# Patient Record
Sex: Female | Born: 1980
Health system: Southern US, Community
[De-identification: ages and names within clinical notes are randomized; demographics above are authoritative.]

## PROBLEM LIST (undated history)

## (undated) ENCOUNTER — Inpatient Hospital Stay (HOSPITAL_COMMUNITY): Payer: Self-pay

## (undated) DIAGNOSIS — R87619 Unspecified abnormal cytological findings in specimens from cervix uteri: Secondary | ICD-10-CM

## (undated) DIAGNOSIS — IMO0002 Reserved for concepts with insufficient information to code with codable children: Secondary | ICD-10-CM

## (undated) DIAGNOSIS — F329 Major depressive disorder, single episode, unspecified: Secondary | ICD-10-CM

## (undated) DIAGNOSIS — J45909 Unspecified asthma, uncomplicated: Secondary | ICD-10-CM

## (undated) DIAGNOSIS — F32A Depression, unspecified: Secondary | ICD-10-CM

## (undated) DIAGNOSIS — F419 Anxiety disorder, unspecified: Secondary | ICD-10-CM

## (undated) DIAGNOSIS — F429 Obsessive-compulsive disorder, unspecified: Secondary | ICD-10-CM

## (undated) DIAGNOSIS — Z8619 Personal history of other infectious and parasitic diseases: Secondary | ICD-10-CM

## (undated) HISTORY — DX: Unspecified asthma, uncomplicated: J45.909

## (undated) HISTORY — DX: Personal history of other infectious and parasitic diseases: Z86.19

## (undated) HISTORY — PX: TONSILLECTOMY: SUR1361

## (undated) HISTORY — PX: LEEP: SHX91

## (undated) HISTORY — DX: Anxiety disorder, unspecified: F41.9

---

## 2006-02-11 ENCOUNTER — Encounter: Admission: RE | Admit: 2006-02-11 | Discharge: 2006-02-11 | Payer: Self-pay | Admitting: Allergy

## 2006-08-04 ENCOUNTER — Encounter (INDEPENDENT_AMBULATORY_CARE_PROVIDER_SITE_OTHER): Payer: Self-pay | Admitting: Specialist

## 2006-08-04 ENCOUNTER — Ambulatory Visit (HOSPITAL_BASED_OUTPATIENT_CLINIC_OR_DEPARTMENT_OTHER): Admission: RE | Admit: 2006-08-04 | Discharge: 2006-08-04 | Payer: Self-pay | Admitting: Otolaryngology

## 2008-09-08 ENCOUNTER — Ambulatory Visit (HOSPITAL_COMMUNITY): Admission: RE | Admit: 2008-09-08 | Discharge: 2008-09-08 | Payer: Self-pay | Admitting: Obstetrics & Gynecology

## 2009-01-05 ENCOUNTER — Inpatient Hospital Stay (HOSPITAL_COMMUNITY): Admission: AD | Admit: 2009-01-05 | Discharge: 2009-01-05 | Payer: Self-pay | Admitting: Obstetrics and Gynecology

## 2009-01-05 ENCOUNTER — Inpatient Hospital Stay (HOSPITAL_COMMUNITY): Admission: AD | Admit: 2009-01-05 | Discharge: 2009-01-05 | Payer: Self-pay | Admitting: Obstetrics & Gynecology

## 2009-01-06 ENCOUNTER — Inpatient Hospital Stay (HOSPITAL_COMMUNITY): Admission: AD | Admit: 2009-01-06 | Discharge: 2009-01-09 | Payer: Self-pay | Admitting: Obstetrics

## 2009-01-07 ENCOUNTER — Encounter (INDEPENDENT_AMBULATORY_CARE_PROVIDER_SITE_OTHER): Payer: Self-pay | Admitting: Obstetrics

## 2010-02-21 IMAGING — US US ABDOMEN COMPLETE
1 series · 14 of 25 positions shown · non-contrast
Comparison: None

CLINICAL DATA: Right upper quadrant pain with burning and
sensation of pressure.  21 weeks estimated gestational age

COMPLETE ABDOMINAL ULTRASOUND

[Series 1: us abdomen complete · 0.25mm/px · 14 of 82 slices shown]
[im 1/82]
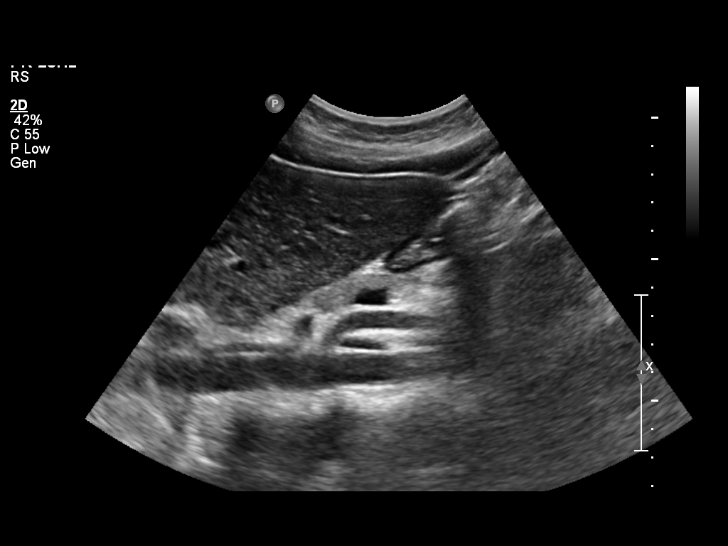
[im 7/82]
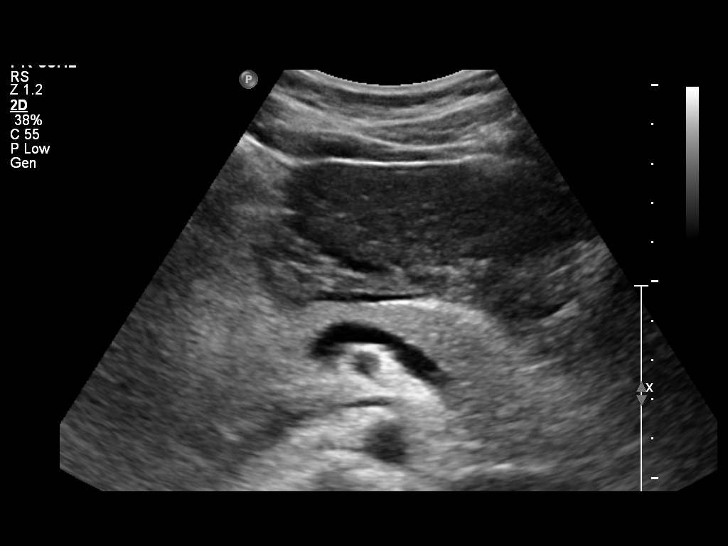
[im 14/82]
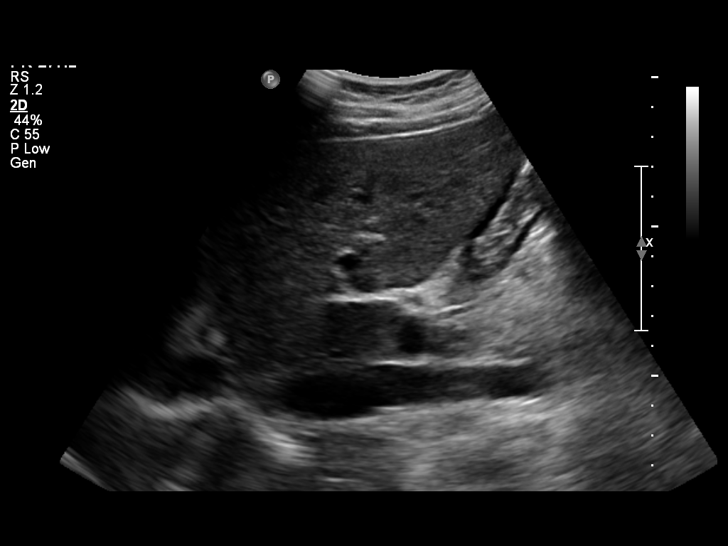
[im 21/82]
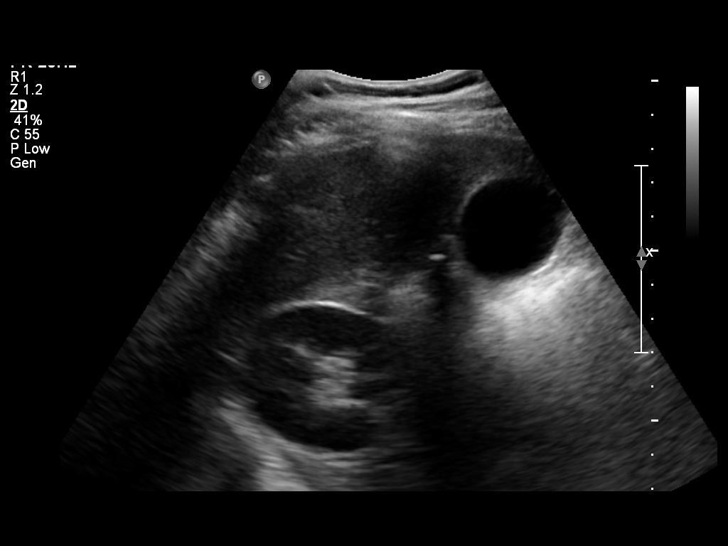
[im 28/82]
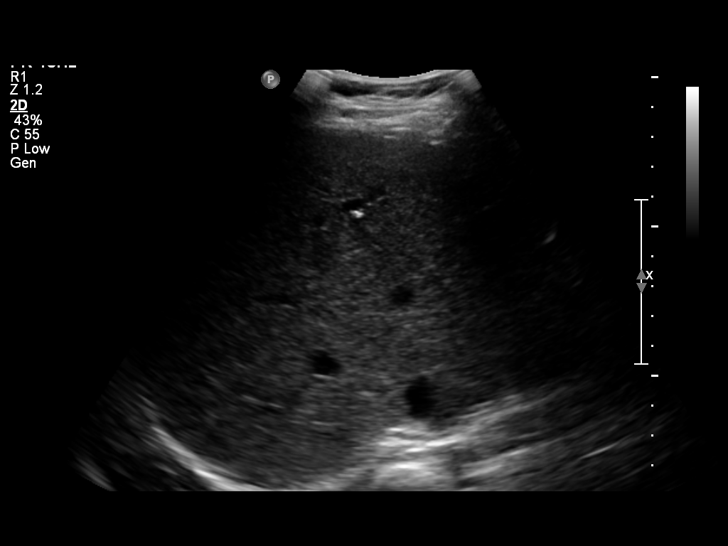
[im 31/82]
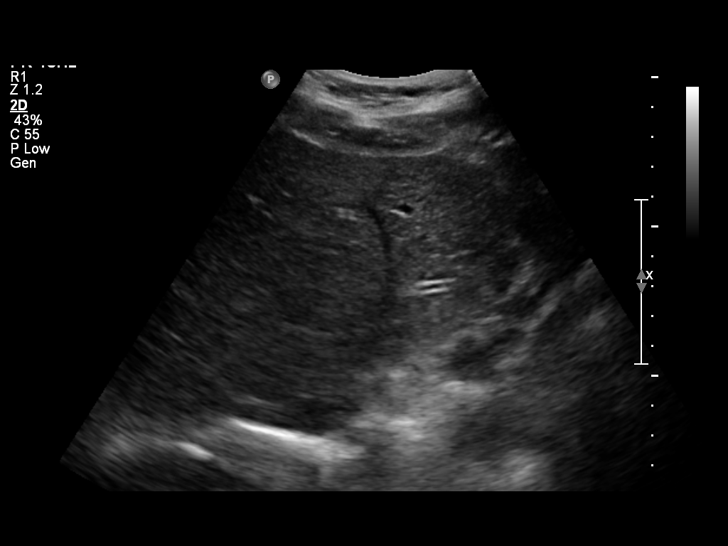
[im 38/82]
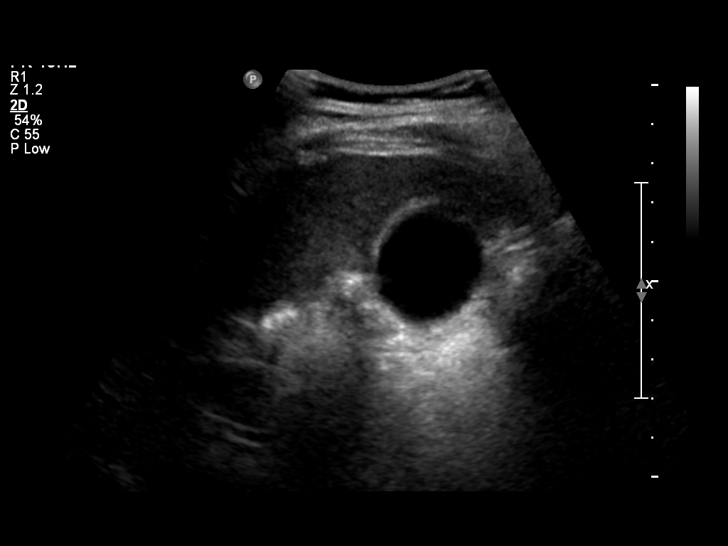
[im 44/82]
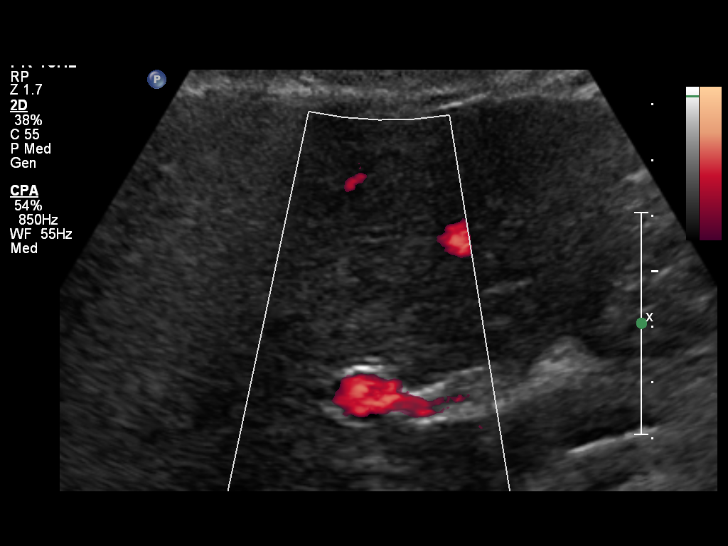
[im 51/82]
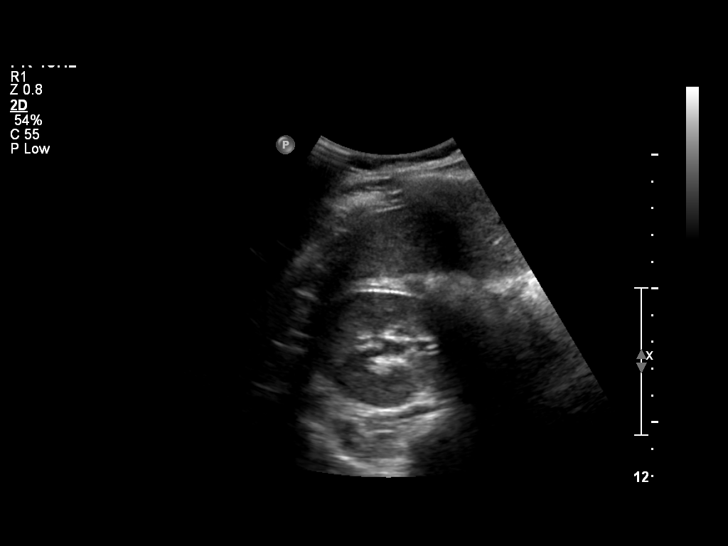
[im 55/82]
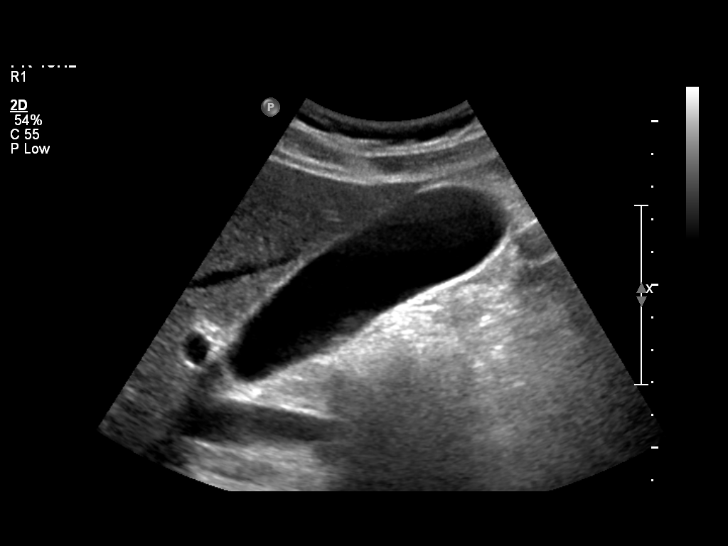
[im 61/82]
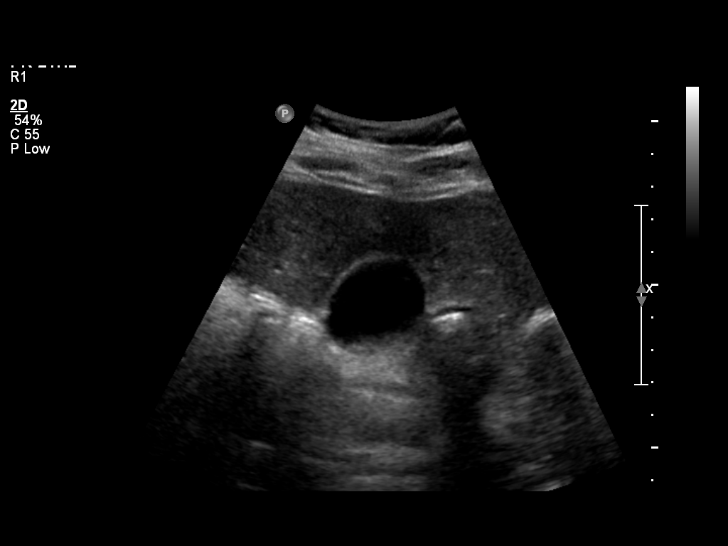
[im 68/82]
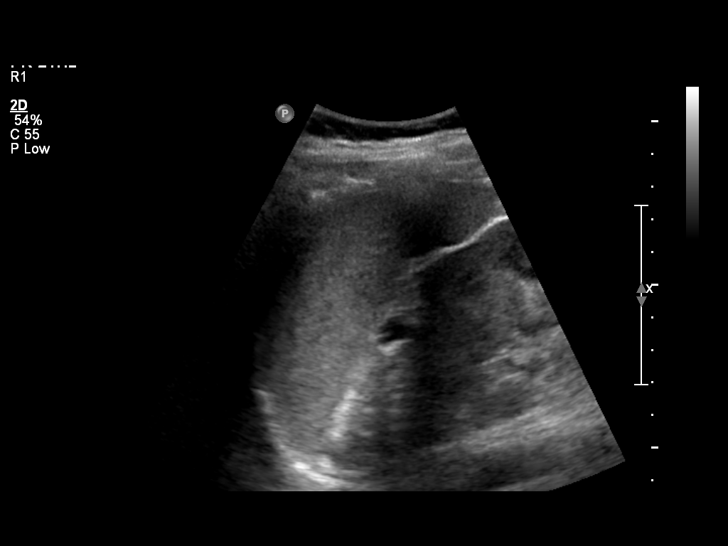
[im 75/82]
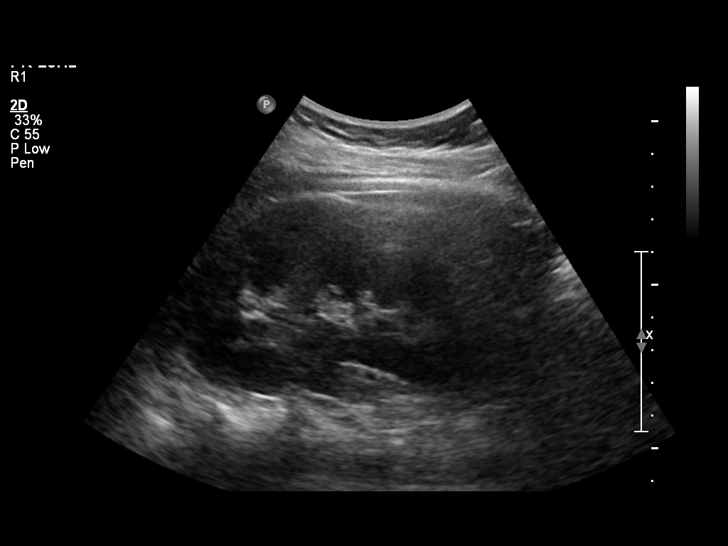
[im 82/82]
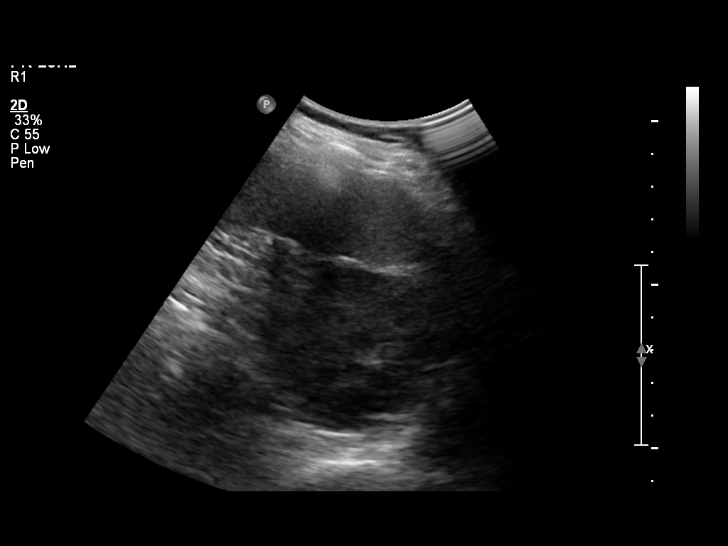

[14 of 25 positions shown; findings below may reference images not displayed]

FINDINGS: Gallbladder:  The gallbladder is well distended and shows no
evidence for intraluminal stones or sludge.  No pericholecystic
fluid or gallbladder wall thickening is seen

Common bile duct:  Normal in appearance with an AP width of 1.0 mm

Liver:  Homogeneous in echotexture with no focal parenchymal
abnormalities or signs of hydronephrosis

IVC:  Proximal aspect appears normal

Pancreas:  Normal in size and echotexture

Spleen:  Normal in size and echotexture

Right Kidney:  Normal in appearance with a sagittal length of
cm.  No focal parenchymal abnormality or evidence for
hydronephrosis is noted

Left Kidney:  Normal in appearance with a sagittal length of
cm.  No focal parenchymal abnormality or signs of hydronephrosis
are noted.

Abdominal aorta:  Normal in appearance with an maximal caliber
measuring 1.2 cm.  The distal most aspect of the aorta is obscured
by the gravid uterus.
IMPRESSION: Normal abdominal ultrasound.

## 2010-07-20 LAB — CBC
HCT: 35.7 % — ABNORMAL LOW (ref 36.0–46.0)
HCT: 41.1 % (ref 36.0–46.0)
Hemoglobin: 12.3 g/dL (ref 12.0–15.0)
Hemoglobin: 14 g/dL (ref 12.0–15.0)
MCHC: 34.1 g/dL (ref 30.0–36.0)
MCV: 91.2 fL (ref 78.0–100.0)
RBC: 3.93 MIL/uL (ref 3.87–5.11)
RBC: 4.5 MIL/uL (ref 3.87–5.11)
WBC: 13.5 10*3/uL — ABNORMAL HIGH (ref 4.0–10.5)

## 2010-07-20 LAB — ABO/RH: ABO/RH(D): O POS

## 2010-08-31 NOTE — Op Note (Signed)
NAMETEPHANIE, Kristen Juarez               ACCOUNT NO.:  [id number]   MEDICAL RECORD NO.:  [id number]          PATIENT TYPE:  AMB   LOCATION:  DSC                          FACILITY:  [facility]   PHYSICIAN:  Hermelinda Medicus, M.D.   DATE OF BIRTH:  26-Dec-1980   DATE OF PROCEDURE:  08/04/2006  DATE OF DISCHARGE:                               OPERATIVE REPORT   The patient is a 30 year old female who has had several, 4-6, episodes  of tonsillitis over several years, going back to high school.  She has a  history of asthma and hay fever and headaches.   PREOPERATIVE DIAGNOSIS:  Tonsillitis.   POSTOPERATIVE DIAGNOSIS:  Tonsillitis.   OPERATION:  Tonsillectomy.   ANESTHESIA:  General endotracheal with Dr. Gypsy Balsam.   SURGEON:  Hermelinda Medicus, M.D.   PROCEDURE:  The patient placed supine position under general  endotracheal anesthesia.  The tonsils were removed using blunt and Bovie  electrocoagulation dissection.  All hemostasis was established with  Bovie electrocoagulation.  Once the tonsils were removed, they were  separated and, of course, sent for microscopic pathology.  The tonsillar  beds were carefully checked for any hemostasis needs and stomach was  suctioned, the nasopharynx was suctioned, and the patient was awake and  tolerated the procedure well, and is doing well.  The patient is aware  of the risks and gains of bleeding, especially if she eats aggressively.  She is suppose to be on a soft bland diet for at least 10-12 days and  cannot travel for 10-12 days any great distance.  She is to return to  see me in five days.           ______________________________  Hermelinda Medicus, M.D.     JC/MEDQ  D:  08/04/2006  T:  08/04/2006  Job:  [id number]   cc:   Emeterio Reeve, MD  Sigmund Hazel, M.D.  Eston Esters, PA-C

## 2010-08-31 NOTE — H&P (Signed)
NAMEBREELLA, VANOSTRAND               ACCOUNT NO.:  [id number]   MEDICAL RECORD NO.:  [id number]          PATIENT TYPE:  AMB   LOCATION:  DSC                          FACILITY:  [facility]   PHYSICIAN:  Hermelinda Medicus, M.D.   DATE OF BIRTH:  1980-06-08   DATE OF ADMISSION:  DATE OF DISCHARGE:                              HISTORY & PHYSICAL   Kristen Juarez is a 30 year old female, entered my office with a history  of multiple episodes of tonsillitis, going back to high school, having 4  to 6 episodes per year.  She has used amoxicillin and Augmentin  primarily, but has had a difficult time going to work.  Sometimes she  will miss 2 to 3 days of work, because of this tonsillar issue.  This is  per episode.  She is very tired of having erythematous tonsils and  persistent problems and therefore enters now for a tonsillectomy.   PAST HISTORY:  Is one of asthma, hay fever, headaches.   SOCIAL HISTORY:  She drinks occasionally, rarely.  Smokes one half pack  a day.   ALLERGIES:  Has an allergy to codeine, rash and apparently has nausea.  Her only surgery is that of wisdom teeth extraction.  She does have  headaches occasionally, due to eyesight difficulties.  She had  difficulty swallowing pills, especially which she is having tonsillitis  problems.   MEDICATIONS:  Are listed in the chart and includes:  1. Zoloft 200 mg.  2. Advair 100/50, two puffs b.i.d.  3. Allegra 180.  4. Klonopin 0.5.  5. Nasonex.  6. Benadryl.  7. Albuterol inhaler p.r.n.   PHYSICAL EXAMINATION:  GENERAL:  Reveals a blood pressure of 120/74 with  A heart rate of 78.  Her weight is 117.  HEENT:  Her ears are clear.  Tympanic membranes are clear and move well.  Her nose is also clear.  Her tonsils show exudate but are in good  condition at this time, as she has been on an antibiotic.  Her larynx is  clear to cords, false cords, epiglottis.  Base of tongue are clear of  ulceration or mass.  True cord mobility, gag  reflex, tongue mobility,  EOMs, facial nerve are all symmetrical.  NECK:  Free of any thyromegaly, cervical adenopathy or mass.  CHEST:  Clear.  No rales, rhonchi or wheezes.  CARDIOVASCULAR:  No murmurs or gallops.  EXTREMITIES:  Unremarkable.   INITIAL DIAGNOSES:  1. Tonsillitis.  2. History of smoking.  3. History of asthma.  4. History of allergies to codeine.  5. Inhalant allergies.  6. History of wisdom tooth extraction.           ______________________________  Hermelinda Medicus, M.D.     JC/MEDQ  D:  08/04/2006  T:  08/04/2006  Job:  [id number]   cc:   Johnn Hai, M.D.  Sigmund Hazel, M.D.  Eston Esters, PA

## 2012-04-15 NOTE — L&D Delivery Note (Signed)
  Kristen Juarez, Kristen Juarez [[id number]]  Delivery Note At 1:51 PM a viable and healthy female was delivered via Vaginal, Spontaneous Delivery (Presentation: ; Occiput Anterior).  APGAR: 8, 9; weight 5 lb 1 oz (2295 g).   Placenta status: Intact, Spontaneous.  Cord: 3 vessels.  Anesthesia: Epidural      Kristen Juarez, Kristen Juarez [[id number]]  Delivery Note At 1:56 PM a viable and healthy female was delivered via Vaginal, Spontaneous Delivery (Presentation: ; Occiput Anterior).  APGAR: 7, 8; weight 4 lb 12.5 oz (2170 g).   Placenta status: Intact, Spontaneous.  Cord: 3 vessels.  Episiotomy: None Lacerations: 1st degree;Perineal Suture Repair: 3.0 chromic Est. Blood Loss (mL): 400  Mom to postpartum.  Babies to nursery-stable.  Kristen Juarez D 11/04/2012, 5:27 PM

## 2012-05-13 ENCOUNTER — Other Ambulatory Visit: Payer: Self-pay

## 2012-06-10 LAB — OB RESULTS CONSOLE RPR: RPR: NONREACTIVE

## 2012-06-10 LAB — OB RESULTS CONSOLE GC/CHLAMYDIA
Chlamydia: NEGATIVE
Gonorrhea: NEGATIVE

## 2012-06-10 LAB — OB RESULTS CONSOLE RUBELLA ANTIBODY, IGM: Rubella: IMMUNE

## 2012-06-10 LAB — OB RESULTS CONSOLE HEPATITIS B SURFACE ANTIGEN: Hepatitis B Surface Ag: NEGATIVE

## 2012-06-30 ENCOUNTER — Other Ambulatory Visit: Payer: Self-pay

## 2012-10-05 ENCOUNTER — Other Ambulatory Visit (HOSPITAL_COMMUNITY): Payer: Self-pay | Admitting: Obstetrics & Gynecology

## 2012-10-05 DIAGNOSIS — IMO0001 Reserved for inherently not codable concepts without codable children: Secondary | ICD-10-CM

## 2012-10-12 ENCOUNTER — Encounter (HOSPITAL_COMMUNITY): Payer: Self-pay

## 2012-10-12 ENCOUNTER — Other Ambulatory Visit (HOSPITAL_COMMUNITY): Payer: Self-pay | Admitting: Obstetrics & Gynecology

## 2012-10-12 ENCOUNTER — Ambulatory Visit (HOSPITAL_COMMUNITY)
Admission: RE | Admit: 2012-10-12 | Discharge: 2012-10-12 | Disposition: A | Discharge status: Home / Self Care | Payer: Medicaid Other | Source: Ambulatory Visit | Attending: Obstetrics & Gynecology | Admitting: Obstetrics & Gynecology

## 2012-10-12 DIAGNOSIS — IMO0001 Reserved for inherently not codable concepts without codable children: Secondary | ICD-10-CM

## 2012-10-12 DIAGNOSIS — O358XX0 Maternal care for other (suspected) fetal abnormality and damage, not applicable or unspecified: Secondary | ICD-10-CM | POA: Insufficient documentation

## 2012-10-12 DIAGNOSIS — Z1389 Encounter for screening for other disorder: Secondary | ICD-10-CM | POA: Insufficient documentation

## 2012-10-12 DIAGNOSIS — O365990 Maternal care for other known or suspected poor fetal growth, unspecified trimester, not applicable or unspecified: Secondary | ICD-10-CM | POA: Insufficient documentation

## 2012-10-12 DIAGNOSIS — Z363 Encounter for antenatal screening for malformations: Secondary | ICD-10-CM | POA: Insufficient documentation

## 2012-10-12 DIAGNOSIS — O30009 Twin pregnancy, unspecified number of placenta and unspecified number of amniotic sacs, unspecified trimester: Secondary | ICD-10-CM | POA: Insufficient documentation

## 2012-10-27 ENCOUNTER — Inpatient Hospital Stay (HOSPITAL_COMMUNITY)
Admission: AD | Admit: 2012-10-27 | Discharge: 2012-10-27 | Disposition: A | Discharge status: Home / Self Care | Payer: Medicaid Other | Source: Ambulatory Visit | Attending: Obstetrics and Gynecology | Admitting: Obstetrics and Gynecology

## 2012-10-27 DIAGNOSIS — O99891 Other specified diseases and conditions complicating pregnancy: Secondary | ICD-10-CM | POA: Insufficient documentation

## 2012-10-27 DIAGNOSIS — O30009 Twin pregnancy, unspecified number of placenta and unspecified number of amniotic sacs, unspecified trimester: Secondary | ICD-10-CM | POA: Insufficient documentation

## 2012-10-27 LAB — URINALYSIS, ROUTINE W REFLEX MICROSCOPIC
Bilirubin Urine: NEGATIVE
Hgb urine dipstick: NEGATIVE
Specific Gravity, Urine: <1.005 — ABNORMAL LOW (ref 1.005–1.030)
pH: 7 (ref 5.0–8.0)

## 2012-10-27 NOTE — MAU Note (Signed)
Patient is in with c/o ctx q 5-77m and leaking clear fluids since yesterday (patient is not wearing pad or pantiliner). She denies vaginal bleeding. Reports good fetal movement.

## 2012-10-27 NOTE — MAU Provider Note (Signed)
S: 32 y.o. [id number] @[redacted]w[redacted]d  with twins pregnancy presents to MAU with leakage of clear fluid intermittently starting yesterday. She describes fluid as clear, no more than a tablespoon at a time, but enough to wet her underwear a few times. She reports babies have been vertex/vertex and she plans a vaginal delivery.  She reports good fetal movement, denies regular contractions, vaginal bleeding, vaginal itching/burning, urinary symptoms, h/a, dizziness, n/v, or fever/chills.    O: BP 121/70  Pulse 102  Temp(Src) 98.1 F (36.7 C) (Oral)  Resp 18  Ht 5' (1.524 m)  Wt 78.132 kg (172 lb 4 oz)  BMI 33.64 kg/m2  SpO2 97%  LMP 02/11/2012  Baby A and Baby B with reactive NSTs  Pooling and ferning negative, moderate amount white discharge  A: G2P1001 @[redacted]w[redacted]d  with twins pregnancy  Intact membranes  P: RN to call Dr Maylene Roes Certified Nurse-Midwife

## 2012-10-27 NOTE — Progress Notes (Signed)
Awaiting call back.

## 2012-10-27 NOTE — Progress Notes (Signed)
Dr Dareen Piano notified of patient, her c/o ctx and possible leaking amniotic fluids. He is aware that lisa leftwich-kirby cnm peformed speculum exam and did not see pooling and observed negative fern. He is notified of sve results, tracing and ctx pattern. Order to discharge home once baby A gets a visible 15x15 accels.

## 2012-11-02 ENCOUNTER — Encounter (HOSPITAL_COMMUNITY): Payer: Self-pay | Admitting: *Deleted

## 2012-11-02 ENCOUNTER — Telehealth (HOSPITAL_COMMUNITY): Payer: Self-pay | Admitting: *Deleted

## 2012-11-02 LAB — OB RESULTS CONSOLE GBS: GBS: NEGATIVE

## 2012-11-02 NOTE — Telephone Encounter (Signed)
Preadmission screen  

## 2012-11-03 ENCOUNTER — Encounter (HOSPITAL_COMMUNITY): Payer: Self-pay

## 2012-11-03 ENCOUNTER — Inpatient Hospital Stay (HOSPITAL_COMMUNITY)
Admission: AD | Admit: 2012-11-03 | Discharge: 2012-11-03 | Disposition: A | Discharge status: Home / Self Care | Payer: Medicaid Other | Source: Ambulatory Visit | Attending: Obstetrics & Gynecology | Admitting: Obstetrics & Gynecology

## 2012-11-03 DIAGNOSIS — O479 False labor, unspecified: Secondary | ICD-10-CM | POA: Insufficient documentation

## 2012-11-03 NOTE — MAU Note (Signed)
Patient states she is having contractions every 3 minutes. Patient is having twins and scheduled for IOL tomorrow.

## 2012-11-03 NOTE — MAU Note (Signed)
Pt states u/c's more consistent since 1600 last pm. Getting progressively stronger. Denies bleeding or lof. Identical twins, vertex/vertex, plans vaginal delivery.

## 2012-11-04 ENCOUNTER — Encounter (HOSPITAL_COMMUNITY): Payer: Self-pay | Admitting: Anesthesiology

## 2012-11-04 ENCOUNTER — Inpatient Hospital Stay (HOSPITAL_COMMUNITY)
Admission: RE | Admit: 2012-11-04 | Discharge: 2012-11-06 | DRG: 767 | Disposition: A | Discharge status: Home / Self Care | Payer: Medicaid Other | Source: Ambulatory Visit | Attending: Obstetrics & Gynecology | Admitting: Obstetrics & Gynecology

## 2012-11-04 ENCOUNTER — Inpatient Hospital Stay (HOSPITAL_COMMUNITY): Payer: Medicaid Other | Admitting: Anesthesiology

## 2012-11-04 ENCOUNTER — Encounter (HOSPITAL_COMMUNITY): Payer: Self-pay

## 2012-11-04 ENCOUNTER — Encounter (HOSPITAL_COMMUNITY)
Admission: RE | Disposition: A | Discharge status: Home / Self Care | Payer: Self-pay | Source: Ambulatory Visit | Attending: Obstetrics & Gynecology

## 2012-11-04 DIAGNOSIS — D649 Anemia, unspecified: Secondary | ICD-10-CM | POA: Diagnosis not present

## 2012-11-04 DIAGNOSIS — Z302 Encounter for sterilization: Secondary | ICD-10-CM | POA: Diagnosis not present

## 2012-11-04 DIAGNOSIS — O30009 Twin pregnancy, unspecified number of placenta and unspecified number of amniotic sacs, unspecified trimester: Secondary | ICD-10-CM | POA: Diagnosis present

## 2012-11-04 DIAGNOSIS — O30039 Twin pregnancy, monochorionic/diamniotic, unspecified trimester: Secondary | ICD-10-CM

## 2012-11-04 DIAGNOSIS — O9903 Anemia complicating the puerperium: Secondary | ICD-10-CM | POA: Diagnosis not present

## 2012-11-04 HISTORY — DX: Reserved for concepts with insufficient information to code with codable children: IMO0002

## 2012-11-04 HISTORY — DX: Obsessive-compulsive disorder, unspecified: F42.9

## 2012-11-04 HISTORY — DX: Major depressive disorder, single episode, unspecified: F32.9

## 2012-11-04 HISTORY — DX: Depression, unspecified: F32.A

## 2012-11-04 HISTORY — DX: Unspecified abnormal cytological findings in specimens from cervix uteri: R87.619

## 2012-11-04 HISTORY — PX: TUBAL LIGATION: SHX77

## 2012-11-04 LAB — CBC
HCT: 41.8 % (ref 36.0–46.0)
Hemoglobin: 14.6 g/dL (ref 12.0–15.0)
MCH: 30.5 pg (ref 26.0–34.0)
MCHC: 34.9 g/dL (ref 30.0–36.0)
MCV: 87.3 fL (ref 78.0–100.0)
RBC: 4.79 MIL/uL (ref 3.87–5.11)

## 2012-11-04 LAB — TYPE AND SCREEN: ABO/RH(D): O POS

## 2012-11-04 SURGERY — LIGATION, FALLOPIAN TUBE, POSTPARTUM
Anesthesia: Epidural | Site: Abdomen | Laterality: Bilateral | Wound class: Clean Contaminated

## 2012-11-04 MED ORDER — LIDOCAINE-EPINEPHRINE (PF) 2 %-1:200000 IJ SOLN
INTRAMUSCULAR | Status: AC
  Filled 2012-11-04: qty 20

## 2012-11-04 MED ORDER — OXYCODONE-ACETAMINOPHEN 5-325 MG PO TABS: 1.0000 | ORAL_TABLET | ORAL | Status: DC | PRN

## 2012-11-04 MED ORDER — LIDOCAINE HCL (PF) 1 % IJ SOLN
30.0000 mL | INTRAMUSCULAR | Status: DC | PRN
  Filled 2012-11-04: qty 30

## 2012-11-04 MED ORDER — DIBUCAINE 1 % RE OINT: 1.0000 "application " | TOPICAL_OINTMENT | RECTAL | Status: DC | PRN

## 2012-11-04 MED ORDER — PHENYLEPHRINE 40 MCG/ML (10ML) SYRINGE FOR IV PUSH (FOR BLOOD PRESSURE SUPPORT)
80.0000 ug | PREFILLED_SYRINGE | INTRAVENOUS | Status: DC | PRN
  Filled 2012-11-04: qty 5

## 2012-11-04 MED ORDER — ONDANSETRON HCL 4 MG/2ML IJ SOLN
INTRAMUSCULAR | Status: AC
  Filled 2012-11-04: qty 2

## 2012-11-04 MED ORDER — FLEET ENEMA 7-19 GM/118ML RE ENEM: 1.0000 | ENEMA | RECTAL | Status: DC | PRN

## 2012-11-04 MED ORDER — OXYTOCIN 40 UNITS IN LACTATED RINGERS INFUSION - SIMPLE MED
1.0000 m[IU]/min | INTRAVENOUS | Status: DC
  Administered 2012-11-04: 2 m[IU]/min via INTRAVENOUS
  Filled 2012-11-04: qty 1000

## 2012-11-04 MED ORDER — ACETAMINOPHEN 325 MG PO TABS: 650.0000 mg | ORAL_TABLET | ORAL | Status: DC | PRN

## 2012-11-04 MED ORDER — PROMETHAZINE HCL 25 MG/ML IJ SOLN: 6.2500 mg | INTRAMUSCULAR | Status: DC | PRN

## 2012-11-04 MED ORDER — OXYCODONE-ACETAMINOPHEN 5-325 MG PO TABS
1.0000 | ORAL_TABLET | ORAL | Status: DC | PRN
  Administered 2012-11-04 – 2012-11-06 (×5): 1 via ORAL
  Filled 2012-11-04 (×5): qty 1

## 2012-11-04 MED ORDER — LIDOCAINE HCL (PF) 1 % IJ SOLN
INTRAMUSCULAR | Status: DC | PRN
  Administered 2012-11-04: 3 mL
  Administered 2012-11-04: 4 mL

## 2012-11-04 MED ORDER — ONDANSETRON HCL 4 MG PO TABS: 4.0000 mg | ORAL_TABLET | ORAL | Status: DC | PRN

## 2012-11-04 MED ORDER — OXYTOCIN BOLUS FROM INFUSION
500.0000 mL | INTRAVENOUS | Status: DC
  Administered 2012-11-04: 500 mL via INTRAVENOUS

## 2012-11-04 MED ORDER — LIDOCAINE HCL 1 % IJ SOLN
INTRAMUSCULAR | Status: DC | PRN
  Administered 2012-11-04: 2 mL

## 2012-11-04 MED ORDER — BUTORPHANOL TARTRATE 1 MG/ML IJ SOLN: 1.0000 mg | INTRAMUSCULAR | Status: DC | PRN

## 2012-11-04 MED ORDER — SIMETHICONE 80 MG PO CHEW: 80.0000 mg | CHEWABLE_TABLET | ORAL | Status: DC | PRN

## 2012-11-04 MED ORDER — FENTANYL 2.5 MCG/ML BUPIVACAINE 1/10 % EPIDURAL INFUSION (WH - ANES)
INTRAMUSCULAR | Status: DC | PRN
  Administered 2012-11-04: 11.5 mL/h via EPIDURAL

## 2012-11-04 MED ORDER — SODIUM BICARBONATE 8.4 % IV SOLN
INTRAVENOUS | Status: DC | PRN
  Administered 2012-11-04: 5 mL via EPIDURAL

## 2012-11-04 MED ORDER — PHENYLEPHRINE 40 MCG/ML (10ML) SYRINGE FOR IV PUSH (FOR BLOOD PRESSURE SUPPORT): 80.0000 ug | PREFILLED_SYRINGE | INTRAVENOUS | Status: DC | PRN

## 2012-11-04 MED ORDER — SERTRALINE HCL 100 MG PO TABS
200.0000 mg | ORAL_TABLET | Freq: Every day | ORAL | Status: DC
  Filled 2012-11-04: qty 2

## 2012-11-04 MED ORDER — 0.9 % SODIUM CHLORIDE (POUR BTL) OPTIME
TOPICAL | Status: DC | PRN
  Administered 2012-11-04: 1000 mL

## 2012-11-04 MED ORDER — HYDROMORPHONE HCL PF 1 MG/ML IJ SOLN: 0.2500 mg | INTRAMUSCULAR | Status: DC | PRN

## 2012-11-04 MED ORDER — EPHEDRINE 5 MG/ML INJ
10.0000 mg | INTRAVENOUS | Status: DC | PRN
  Filled 2012-11-04: qty 4

## 2012-11-04 MED ORDER — BENZOCAINE-MENTHOL 20-0.5 % EX AERO: 1.0000 "application " | INHALATION_SPRAY | CUTANEOUS | Status: DC | PRN

## 2012-11-04 MED ORDER — FENTANYL CITRATE 0.05 MG/ML IJ SOLN
INTRAMUSCULAR | Status: AC
  Filled 2012-11-04: qty 2

## 2012-11-04 MED ORDER — EPHEDRINE 5 MG/ML INJ: 10.0000 mg | INTRAVENOUS | Status: DC | PRN

## 2012-11-04 MED ORDER — LACTATED RINGERS IV SOLN
INTRAVENOUS | Status: DC
  Administered 2012-11-04 (×4): via INTRAVENOUS

## 2012-11-04 MED ORDER — PRENATAL MULTIVITAMIN CH: 1.0000 | ORAL_TABLET | Freq: Every day | ORAL | Status: DC

## 2012-11-04 MED ORDER — SODIUM BICARBONATE 8.4 % IV SOLN
INTRAVENOUS | Status: AC
  Filled 2012-11-04: qty 50

## 2012-11-04 MED ORDER — LANOLIN HYDROUS EX OINT: TOPICAL_OINTMENT | CUTANEOUS | Status: DC | PRN

## 2012-11-04 MED ORDER — WITCH HAZEL-GLYCERIN EX PADS: 1.0000 "application " | MEDICATED_PAD | CUTANEOUS | Status: DC | PRN

## 2012-11-04 MED ORDER — SENNOSIDES-DOCUSATE SODIUM 8.6-50 MG PO TABS
2.0000 | ORAL_TABLET | Freq: Every day | ORAL | Status: DC
  Administered 2012-11-04 – 2012-11-05 (×2): 2 via ORAL

## 2012-11-04 MED ORDER — KETOROLAC TROMETHAMINE 30 MG/ML IJ SOLN: 15.0000 mg | Freq: Once | INTRAMUSCULAR | Status: DC | PRN

## 2012-11-04 MED ORDER — LACTATED RINGERS IV SOLN
500.0000 mL | Freq: Once | INTRAVENOUS | Status: AC
  Administered 2012-11-04: 500 mL via INTRAVENOUS

## 2012-11-04 MED ORDER — ZOLPIDEM TARTRATE 5 MG PO TABS: 5.0000 mg | ORAL_TABLET | Freq: Every evening | ORAL | Status: DC | PRN

## 2012-11-04 MED ORDER — IBUPROFEN 600 MG PO TABS
600.0000 mg | ORAL_TABLET | Freq: Four times a day (QID) | ORAL | Status: DC
  Administered 2012-11-05 – 2012-11-06 (×6): 600 mg via ORAL
  Filled 2012-11-04 (×6): qty 1

## 2012-11-04 MED ORDER — DIPHENHYDRAMINE HCL 25 MG PO CAPS: 25.0000 mg | ORAL_CAPSULE | Freq: Four times a day (QID) | ORAL | Status: DC | PRN

## 2012-11-04 MED ORDER — PHENYLEPHRINE HCL 10 MG/ML IJ SOLN
INTRAMUSCULAR | Status: DC | PRN
  Administered 2012-11-04: 80 ug via INTRAVENOUS

## 2012-11-04 MED ORDER — IBUPROFEN 600 MG PO TABS
600.0000 mg | ORAL_TABLET | Freq: Four times a day (QID) | ORAL | Status: DC | PRN
  Administered 2012-11-04: 600 mg via ORAL
  Filled 2012-11-04: qty 1

## 2012-11-04 MED ORDER — TERBUTALINE SULFATE 1 MG/ML IJ SOLN: 0.2500 mg | Freq: Once | INTRAMUSCULAR | Status: DC | PRN

## 2012-11-04 MED ORDER — ONDANSETRON HCL 4 MG/2ML IJ SOLN
4.0000 mg | Freq: Four times a day (QID) | INTRAMUSCULAR | Status: DC | PRN
  Administered 2012-11-04: 4 mg via INTRAVENOUS
  Filled 2012-11-04: qty 2

## 2012-11-04 MED ORDER — ONDANSETRON HCL 4 MG/2ML IJ SOLN: 4.0000 mg | INTRAMUSCULAR | Status: DC | PRN

## 2012-11-04 MED ORDER — ONDANSETRON HCL 4 MG/2ML IJ SOLN
INTRAMUSCULAR | Status: DC | PRN
  Administered 2012-11-04: 4 mg via INTRAVENOUS

## 2012-11-04 MED ORDER — MIDAZOLAM HCL 2 MG/2ML IJ SOLN
INTRAMUSCULAR | Status: AC
  Filled 2012-11-04: qty 2

## 2012-11-04 MED ORDER — CITRIC ACID-SODIUM CITRATE 334-500 MG/5ML PO SOLN
30.0000 mL | ORAL | Status: DC | PRN
  Administered 2012-11-04: 30 mL via ORAL
  Filled 2012-11-04 (×2): qty 15

## 2012-11-04 MED ORDER — OXYTOCIN 40 UNITS IN LACTATED RINGERS INFUSION - SIMPLE MED: 62.5000 mL/h | INTRAVENOUS | Status: DC

## 2012-11-04 MED ORDER — PHENYLEPHRINE 40 MCG/ML (10ML) SYRINGE FOR IV PUSH (FOR BLOOD PRESSURE SUPPORT)
PREFILLED_SYRINGE | INTRAVENOUS | Status: AC
  Filled 2012-11-04: qty 5

## 2012-11-04 MED ORDER — TETANUS-DIPHTH-ACELL PERTUSSIS 5-2.5-18.5 LF-MCG/0.5 IM SUSP
0.5000 mL | Freq: Once | INTRAMUSCULAR | Status: AC
  Administered 2012-11-06: 0.5 mL via INTRAMUSCULAR

## 2012-11-04 MED ORDER — LACTATED RINGERS IV SOLN
500.0000 mL | INTRAVENOUS | Status: DC | PRN
  Administered 2012-11-04: 500 mL via INTRAVENOUS
  Administered 2012-11-04 (×2): 300 mL via INTRAVENOUS

## 2012-11-04 MED ORDER — FENTANYL 2.5 MCG/ML BUPIVACAINE 1/10 % EPIDURAL INFUSION (WH - ANES)
14.0000 mL/h | INTRAMUSCULAR | Status: DC | PRN
  Filled 2012-11-04: qty 125

## 2012-11-04 MED ORDER — DIPHENHYDRAMINE HCL 50 MG/ML IJ SOLN: 12.5000 mg | INTRAMUSCULAR | Status: DC | PRN

## 2012-11-04 MED ORDER — MEPERIDINE HCL 25 MG/ML IJ SOLN: 6.2500 mg | INTRAMUSCULAR | Status: DC | PRN

## 2012-11-04 SURGICAL SUPPLY — 19 items
ADH SKN CLS APL DERMABOND .7 (GAUZE/BANDAGES/DRESSINGS) ×1
CLIP FILSHIE TUBAL LIGA STRL (Clip) ×4 IMPLANT
CLOTH BEACON ORANGE TIMEOUT ST (SAFETY) ×2 IMPLANT
CONTAINER PREFILL 10% NBF 15ML (MISCELLANEOUS) ×2 IMPLANT
DERMABOND ADVANCED (GAUZE/BANDAGES/DRESSINGS) ×1
DERMABOND ADVANCED .7 DNX12 (GAUZE/BANDAGES/DRESSINGS) IMPLANT
GLOVE ECLIPSE 6.0 STRL STRAW (GLOVE) ×2 IMPLANT
GLOVE ECLIPSE 6.5 STRL STRAW (GLOVE) ×1 IMPLANT
GOWN PREVENTION PLUS LG XLONG (DISPOSABLE) ×4 IMPLANT
NS IRRIG 1000ML POUR BTL (IV SOLUTION) ×2 IMPLANT
PACK ABDOMINAL MINOR (CUSTOM PROCEDURE TRAY) ×2 IMPLANT
SPONGE LAP 4X18 X RAY DECT (DISPOSABLE) ×1 IMPLANT
SUT VIC AB 3-0 SH 27 (SUTURE)
SUT VIC AB 3-0 SH 27X BRD (SUTURE) IMPLANT
SUT VICRYL 0 UR6 27IN ABS (SUTURE) ×2 IMPLANT
SUT VICRYL 4-0 PS2 18IN ABS (SUTURE) ×2 IMPLANT
TOWEL OR 17X24 6PK STRL BLUE (TOWEL DISPOSABLE) ×4 IMPLANT
TRAY FOLEY CATH 14FR (SET/KITS/TRAYS/PACK) ×2 IMPLANT
WATER STERILE IRR 1000ML POUR (IV SOLUTION) ×1 IMPLANT

## 2012-11-04 NOTE — Anesthesia Postprocedure Evaluation (Signed)
Anesthesia Post Note  Patient: Kristen Juarez  Procedure(s) Performed: Procedure(s) (LRB): POST PARTUM TUBAL LIGATION (Bilateral)  Anesthesia type: Epidural  Patient location: PACU  Post pain: Pain level controlled  Post assessment: Post-op Vital signs reviewed  Last Vitals:  Filed Vitals:   11/04/12 1826  BP: 97/36  Pulse: 77  Temp: 36.6 C  Resp: 14    Post vital signs: Reviewed  Level of consciousness: awake  Complications: No apparent anesthesia complications

## 2012-11-04 NOTE — Anesthesia Preprocedure Evaluation (Signed)
Anesthesia Evaluation  Patient identified by MRN, date of birth, ID band Patient awake    Reviewed: Allergy & Precautions, H&P , Patient's Chart, lab work & pertinent test results  Airway Mallampati: III TM Distance: >3 FB Neck ROM: full    Dental no notable dental hx. (+) Teeth Intact   Pulmonary neg pulmonary ROS, asthma ,  breath sounds clear to auscultation  Pulmonary exam normal       Cardiovascular negative cardio ROS  Rhythm:regular Rate:Normal     Neuro/Psych negative neurological ROS  negative psych ROS   GI/Hepatic negative GI ROS, Neg liver ROS,   Endo/Other  negative endocrine ROS  Renal/GU negative Renal ROS  negative genitourinary   Musculoskeletal   Abdominal Normal abdominal exam  (+)   Peds  Hematology negative hematology ROS (+)   Anesthesia Other Findings   Reproductive/Obstetrics (+) Pregnancy                           Anesthesia Physical Anesthesia Plan  ASA: II  Anesthesia Plan: Epidural   Post-op Pain Management:    Induction:   Airway Management Planned:   Additional Equipment:   Intra-op Plan:   Post-operative Plan:   Informed Consent: I have reviewed the patients History and Physical, chart, labs and discussed the procedure including the risks, benefits and alternatives for the proposed anesthesia with the patient or authorized representative who has indicated his/her understanding and acceptance.     Plan Discussed with: Anesthesiologist  Anesthesia Plan Comments:         Anesthesia Quick Evaluation

## 2012-11-04 NOTE — Progress Notes (Signed)
Patient was referred for history of depression/anxiety. * Referral screened out by Clinical Social Worker because none of the following criteria appear to apply: ~ History of anxiety/depression during this pregnancy, or of post-partum depression. ~ Diagnosis of anxiety and/or depression within last 3 years ~ History of depression due to pregnancy loss/loss of child OR * Patient's symptoms currently being treated with medication and/or therapy. Please contact the Clinical Social Worker if needs arise, or if patient requests.  MOB has rx for Zoloft. 

## 2012-11-04 NOTE — H&P (Signed)
  32 y.o. [id number]  Estimated Date of Delivery: 11/23/12 admitted at 37/[redacted] weeks gestation for induction.  Prenatal Transfer Tool  Maternal Diabetes: No Genetic Screening: Normal Maternal Ultrasounds/Referrals: Abnormal:  Findings:   Other: mono/di twins Fetal Ultrasounds or other Referrals:  Referred to [facility] for serial growth scans throughout the pregnancy.  They recommended delivery around 37 weeks Maternal Substance Abuse:  No Significant Maternal Medications:  None Significant Maternal Lab Results: None Other Significant Pregnancy Complications:  None  Afebrile, VSS Heart and Lungs: No active disease Abdomen: soft, gravid, EFW AGA. Cervical exam:  2/60 -2  Impression: Vtx,Vtx identical twins with concordant growth  Plan:  Induction

## 2012-11-04 NOTE — Progress Notes (Signed)
I have interviewed and performed the pertinent exams on my patient to confirm that there have been no significant changes in her condition since the dictation of her history and physical exam.  She requests permanent sterilization and is aware of a 05/998 failure rate.

## 2012-11-04 NOTE — Op Note (Signed)
Mickel Baas Physician Signed Obstetrics Op Note 01/15/2011 1:37 PM     Patient Name: Kristen Juarez  MRN: [id number]  Date of Surgery: 11/04/2012    PREOPERATIVE DIAGNOSIS: desire sterilization  POSTOPERATIVE DIAGNOSIS: desire sterilization   PROCEDURE: POST PARTUM TUBAL STERILIZATION  SURGEON: Tiffiny Worthy D. Arlyce Dice M.D.  ANESTHESIA: Epidural  ESTIMATED BLOOD LOSS: Minimal  FINDINGS: Normal adnexa   INDICATIONS: PP twin delivery, desires sterilization.  PROCEDURE IN DETAIL: The abdomen was prepped and draped in sterile fashion and the bladder was emptied. A 3 cm subumbilical incision was made and carried down to the peritoneal cavity. The right fallopian tube was grasped with a Babcock clamp and traced to the fimbriated end. The isthmic-ampullary junction was elevated and a Filshie Clip was applied to occlude the tube. The identical procedure was carried out on the left fallopian tube. The fascia was closed with running 0-Vicryl suture. The skin was closed with 4-0 vicryl in a subcuticular fashion. The procedure was then terminated and the patient left the operating room in good condition.

## 2012-11-04 NOTE — Anesthesia Preprocedure Evaluation (Signed)
Anesthesia Evaluation  Patient identified by MRN, date of birth, ID band Patient awake    Reviewed: Allergy & Precautions, H&P , Patient's Chart, lab work & pertinent test results  Airway Mallampati: III TM Distance: >3 FB Neck ROM: full    Dental no notable dental hx. (+) Teeth Intact   Pulmonary neg pulmonary ROS, asthma ,  breath sounds clear to auscultation  Pulmonary exam normal       Cardiovascular negative cardio ROS  Rhythm:regular Rate:Normal     Neuro/Psych negative neurological ROS  negative psych ROS   GI/Hepatic negative GI ROS, Neg liver ROS,   Endo/Other  negative endocrine ROS  Renal/GU negative Renal ROS  negative genitourinary   Musculoskeletal   Abdominal Normal abdominal exam  (+)   Peds  Hematology negative hematology ROS (+)   Anesthesia Other Findings   Reproductive/Obstetrics (+) Pregnancy                           Anesthesia Physical  Anesthesia Plan  ASA: II  Anesthesia Plan: Epidural   Post-op Pain Management:    Induction:   Airway Management Planned:   Additional Equipment:   Intra-op Plan:   Post-operative Plan:   Informed Consent: I have reviewed the patients History and Physical, chart, labs and discussed the procedure including the risks, benefits and alternatives for the proposed anesthesia with the patient or authorized representative who has indicated his/her understanding and acceptance.     Plan Discussed with: CRNA and Surgeon  Anesthesia Plan Comments: (Plan to use epidural for PPTL.)        Anesthesia Quick Evaluation

## 2012-11-04 NOTE — Anesthesia Procedure Notes (Signed)
Epidural Patient location during procedure: OB Start time: 11/04/2012 8:56 AM  Staffing Anesthesiologist: Armstead Heiland A. Performed by: anesthesiologist   Preanesthetic Checklist Completed: patient identified, site marked, surgical consent, pre-op evaluation, timeout performed, IV checked, risks and benefits discussed and monitors and equipment checked  Epidural Patient position: sitting Prep: site prepped and draped and DuraPrep Patient monitoring: continuous pulse ox and blood pressure Approach: midline Injection technique: LOR air  Needle:  Needle type: Tuohy  Needle gauge: 17 G Needle length: 9 cm and 9 Needle insertion depth: 4 cm Catheter type: closed end flexible Catheter size: 19 Gauge Catheter at skin depth: 9 cm Test dose: negative and Other  Assessment Events: blood not aspirated, injection not painful, no injection resistance, negative IV test and no paresthesia  Additional Notes Patient identified. Risks and benefits discussed including failed block, incomplete  Pain control, post dural puncture headache, nerve damage, paralysis, blood pressure Changes, nausea, vomiting, reactions to medications-both toxic and allergic and post Partum back pain. All questions were answered. Patient expressed understanding and wished to proceed. Sterile technique was used throughout procedure. Epidural site was Dressed with sterile barrier dressing. No paresthesias, signs of intravascular injection Or signs of intrathecal spread were encountered.  Patient was more comfortable after the epidural was dosed. Please see RN's note for documentation of vital signs and FHR which are stable.

## 2012-11-04 NOTE — Progress Notes (Signed)
Kristen Dice, MD, notified of tachysystole. Orders received to turn off pitocin and if contractions space out then to restart pitocin at 2 milliunits. MD also notified of FSE placed on baby A with the baseline running at 170/some minimal variability/variable decels, and baby B's FHR of 115.

## 2012-11-04 NOTE — Transfer of Care (Signed)
2Immediate Anesthesia Transfer of Care Note  Patient: Kristen Juarez  Procedure(s) Performed: Procedure(s): POST PARTUM TUBAL LIGATION (Bilateral)  Patient Location: PACU  Anesthesia Type:Epidural  Level of Consciousness: awake  Airway & Oxygen Therapy: Patient Spontanous Breathing  Post-op Assessment: Report given to PACU RN  Post vital signs: Reviewed and stable  Complications: No apparent anesthesia complications

## 2012-11-05 ENCOUNTER — Encounter (HOSPITAL_COMMUNITY): Payer: Self-pay | Admitting: Obstetrics & Gynecology

## 2012-11-05 LAB — CBC
Hemoglobin: 9.5 g/dL — ABNORMAL LOW (ref 12.0–15.0)
MCV: 87.4 fL (ref 78.0–100.0)
Platelets: 142 10*3/uL — ABNORMAL LOW (ref 150–400)
RBC: 3.18 MIL/uL — ABNORMAL LOW (ref 3.87–5.11)
WBC: 13 10*3/uL — ABNORMAL HIGH (ref 4.0–10.5)

## 2012-11-05 MED ORDER — SERTRALINE HCL 100 MG PO TABS
200.0000 mg | ORAL_TABLET | Freq: Every day | ORAL | Status: DC
  Administered 2012-11-05 (×2): 200 mg via ORAL
  Filled 2012-11-05 (×2): qty 2

## 2012-11-05 MED ORDER — PRENATAL MULTIVITAMIN CH
1.0000 | ORAL_TABLET | Freq: Every day | ORAL | Status: DC
  Administered 2012-11-05: 1 via ORAL

## 2012-11-05 NOTE — Progress Notes (Signed)
UR chart review completed.  

## 2012-11-05 NOTE — Lactation Note (Signed)
This note was copied from the chart of [employer]. Lactation Consultation Note  Patient Name: Kristen Juarez [place] [id number] Date: 11/05/2012 Reason for consult: Follow-up assessment  See breast feeding history on the note copied  From  Baby  B chart  Per mom this baby has been sluggish, but this after noon latched for 8 mins and stayed latch. @ this consult assisted mom with the latch on the right breast for 10 mins , consistent pattern with swallows, increased with breast compressions. Per mom comfortable with latch.  Discussed with the need for post pumping at some point , due to steady flow of colostrum today and babies latching and doing lots of skin to skin,  and the ability to hand express well. DEBP at bedside. Per mom active with WIC , has a DEBP [place] at home . Due to Twins LC recommended  calling [facility] for a hospital grade pump.    Maternal Data    Feeding Feeding Type: Breast Milk Length of feed: 10 min (consistent pattern with multiplyu swallows aand gulps, )  LATCH Score/Interventions Latch: Repeated attempts needed to sustain latch, nipple held in mouth throughout feeding, stimulation needed to elicit sucking reflex. Intervention(s): Adjust position;Assist with latch;Breast massage;Breast compression  Audible Swallowing: Spontaneous and intermittent  Type of Nipple: Everted at rest and after stimulation  Comfort (Breast/Nipple): Soft / non-tender     Hold (Positioning): Assistance needed to correctly position infant at breast and maintain latch. Intervention(s): Breastfeeding basics reviewed;Support Pillows;Position options;Skin to skin  LATCH Score: 8  Lactation Tools Discussed/Used     Consult Status Consult Status: Follow-up Date: 11/06/12 Follow-up type: In-patient    Kathrin Greathouse 11/05/2012, 3:51 PM

## 2012-11-05 NOTE — Anesthesia Postprocedure Evaluation (Signed)
Anesthesia Post Note  Patient: Kristen Juarez  Procedure(s) Performed: * No procedures listed *  Anesthesia type: Epidural  Patient location: Mother/Baby  Post pain: Pain level controlled  Post assessment: Post-op Vital signs reviewed  Last Vitals:  Filed Vitals:   11/05/12 0535  BP: 100/62  Pulse: 76  Temp: 36.5 C  Resp: 18    Post vital signs: Reviewed  Level of consciousness: awake  Complications: No apparent anesthesia complications

## 2012-11-05 NOTE — Lactation Note (Signed)
This note was copied from the chart of Kristen [place] [facility]. Lactation Consultation Note  Patient Name: Kristen Juarez Today's Date: 11/05/2012 Reason for consult: Follow-up assessment Per mom breast fed 1st baby 2 1/2 years , had great milk supply,and has been leaking  for a few weeks. Twins have been improving with latching, Baby B is still more  aggressive compared to Baby A. @ this consult baby A latched after Baby B and they fed both at the same time. Baby A needed assisted with depth and sustained latch for 10 mins with multiply swallows and gulps. Increased with breast compressions. Per mom comfort able  with latch and feeding both babies at the same time.        Maternal Data    Feeding Feeding Type: Breast Milk Length of feed: 10 min  LATCH Score/Interventions Latch: Repeated attempts needed to sustain latch, nipple held in mouth throughout feeding, stimulation needed to elicit sucking reflex. Intervention(s): Adjust position;Assist with latch;Breast massage;Breast compression  Audible Swallowing: Spontaneous and intermittent  Type of Nipple: Everted at rest and after stimulation  Comfort (Breast/Nipple): Soft / non-tender     Hold (Positioning): Assistance needed to correctly position infant at breast and maintain latch. Intervention(s): Breastfeeding basics reviewed;Support Pillows;Position options;Skin to skin  LATCH Score: 8  Lactation Tools Discussed/Used     Consult Status Consult Status: Follow-up Date: 11/06/12 Follow-up type: In-patient    Kristen Juarez 11/05/2012, 3:30 PM    

## 2012-11-05 NOTE — Progress Notes (Addendum)
PPD#1 s/p NSVD twins, POD#1 PPTL Patient is eating, ambulating, voiding.  Pain control is good.  Appropriate lochia.  BF going well.   No dizziness or lightheadedness.  No complaints.  Filed Vitals:   11/04/12 1948 11/04/12 2050 11/05/12 0049 11/05/12 0535  BP: 108/67 117/76 114/67 100/62  Pulse: 65 81 76 76  Temp: 98 F (36.7 C) 98 F (36.7 C) 97.5 F (36.4 C) 97.7 F (36.5 C)  TempSrc:  Oral Oral Oral  Resp: 16 18 18 18   Height:      Weight:      SpO2: 98%       Fundus firm Perineum without swelling. No CT  Lab Results  Component Value Date   WBC 13.0* 11/05/2012   HGB 9.5* 11/05/2012   HCT 27.8* 11/05/2012   MCV 87.4 11/05/2012   PLT 142* 11/05/2012    --/--/O POS (07/23 0910)  A/P Post partum day 1. Anemia - discussed with pt, Hb 9.5, but initially 14.6.  Pt thinks she was probably somewhat dehydrated when she came in.  Can start Fe qd wit hPNV if desired.  She is feeling quite well now.  Routine care.   Kristen Juarez

## 2012-11-06 MED ORDER — OXYCODONE-ACETAMINOPHEN 5-325 MG PO TABS: 1.0000 | ORAL_TABLET | ORAL | Status: DC | PRN

## 2012-11-06 NOTE — Discharge Summary (Signed)
Obstetric Discharge Summary Reason for Admission: induction of labor Prenatal Procedures: BPPs Intrapartum Procedures: spontaneous vaginal delivery Postpartum Procedures: P.P. tubal ligation Complications-Operative and Postpartum: 1st degree perineal laceration Hemoglobin  Date Value Range Status  11/05/2012 9.5* 12.0 - 15.0 g/dL Final     DELTA CHECK NOTED     REPEATED TO VERIFY     HCT  Date Value Range Status  11/05/2012 27.8* 36.0 - 46.0 % Final    Physical Exam:  General: alert and cooperative Lochia: appropriate Uterine Fundus: firm Incision: healing well, no significant drainage DVT Evaluation: No evidence of DVT seen on physical exam.  Discharge Diagnoses: Term Pregnancy-delivered  Discharge Information: Date: 11/06/2012 Activity: pelvic rest Diet: routine Medications: PNV, Ibuprofen and Percocet Condition: stable Instructions: refer to practice specific booklet Discharge to: home Follow-up Information   Follow up with Mickel Baas, MD In 4 weeks.   Contact information:   [address] [place] [address] [phone]       Newborn Data:   Lourdez, Mcgahan [[id number]]  Live born female  Birth Weight: 5 lb 1 oz (2295 g) APGAR: 8, 9   Hoeg, LISETTE MANCEBO [[id number]]  Live born female  Birth Weight: 4 lb 12.5 oz (2170 g) APGAR: 7, 8  Home with mother.  Philip Aspen 11/06/2012, 9:09 AM

## 2013-03-20 ENCOUNTER — Other Ambulatory Visit: Payer: Self-pay | Admitting: Internal Medicine

## 2013-03-22 NOTE — Telephone Encounter (Signed)
RX was called in Friday # 60 no refill, patient needs OV for additional refills

## 2013-04-05 ENCOUNTER — Other Ambulatory Visit: Payer: Self-pay | Admitting: Internal Medicine

## 2013-04-05 DIAGNOSIS — J209 Acute bronchitis, unspecified: Secondary | ICD-10-CM

## 2013-04-05 DIAGNOSIS — F329 Major depressive disorder, single episode, unspecified: Secondary | ICD-10-CM | POA: Insufficient documentation

## 2013-04-05 DIAGNOSIS — F32A Depression, unspecified: Secondary | ICD-10-CM | POA: Insufficient documentation

## 2013-04-05 DIAGNOSIS — J45909 Unspecified asthma, uncomplicated: Secondary | ICD-10-CM | POA: Insufficient documentation

## 2013-04-05 DIAGNOSIS — F429 Obsessive-compulsive disorder, unspecified: Secondary | ICD-10-CM | POA: Insufficient documentation

## 2013-04-05 DIAGNOSIS — IMO0002 Reserved for concepts with insufficient information to code with codable children: Secondary | ICD-10-CM | POA: Insufficient documentation

## 2013-04-05 MED ORDER — PROMETHAZINE-DM 6.25-15 MG/5ML PO SYRP
ORAL_SOLUTION | ORAL | Status: DC
Start: 2013-04-05 — End: 2013-04-13

## 2013-04-05 MED ORDER — AZITHROMYCIN 250 MG PO TABS: ORAL_TABLET | ORAL | Status: AC

## 2013-04-06 ENCOUNTER — Ambulatory Visit: Payer: Self-pay | Admitting: Physician Assistant

## 2013-04-13 ENCOUNTER — Ambulatory Visit (INDEPENDENT_AMBULATORY_CARE_PROVIDER_SITE_OTHER): Payer: BC Managed Care – PPO | Admitting: Physician Assistant

## 2013-04-13 ENCOUNTER — Encounter: Payer: Self-pay | Admitting: Physician Assistant

## 2013-04-13 DIAGNOSIS — J209 Acute bronchitis, unspecified: Secondary | ICD-10-CM

## 2013-04-13 MED ORDER — LEVOFLOXACIN 500 MG PO TABS: 500.0000 mg | ORAL_TABLET | Freq: Every day | ORAL | Status: DC

## 2013-04-13 MED ORDER — PREDNISONE 20 MG PO TABS: ORAL_TABLET | ORAL | Status: DC

## 2013-04-13 NOTE — Progress Notes (Signed)
   Subjective:    Patient ID: Kristen Juarez, female    DOB: [birth year], 32 y.o.   MRN: [id number]  Cough This is a new problem. The current episode started in the past 7 days. The problem occurs constantly. The cough is productive of purulent sputum. Associated symptoms include chills, ear congestion, headaches, nasal congestion, postnasal drip, rhinorrhea and wheezing. Pertinent negatives include no chest pain, ear pain, fever, heartburn, hemoptysis, myalgias, rash, sore throat, shortness of breath, sweats or weight loss. Nothing aggravates the symptoms. She has tried prescription cough suppressant (zpak) for the symptoms. The treatment provided no relief. Her past medical history is significant for asthma.    Review of Systems  Constitutional: Positive for chills. Negative for fever, weight loss and fatigue.  HENT: Positive for congestion, postnasal drip and rhinorrhea. Negative for ear pain and sore throat.   Eyes: Negative.   Respiratory: Positive for cough and wheezing. Negative for hemoptysis and shortness of breath.   Cardiovascular: Negative.  Negative for chest pain.  Gastrointestinal: Negative.  Negative for heartburn.  Musculoskeletal: Negative for myalgias.  Skin: Negative for rash.  Neurological: Positive for headaches.      Objective:   Physical Exam  Constitutional: She is oriented to person, place, and time. She appears well-developed and well-nourished.  HENT:  Head: Normocephalic and atraumatic.  Right Ear: External ear normal.  Left Ear: External ear normal.  Nose: Right sinus exhibits maxillary sinus tenderness. Left sinus exhibits maxillary sinus tenderness.  Mouth/Throat: Oropharynx is clear and moist.  Eyes: Conjunctivae are normal. Pupils are equal, round, and reactive to light.  Neck: Normal range of motion. Neck supple.  Cardiovascular: Normal rate and regular rhythm.   Pulmonary/Chest: Effort normal. No respiratory distress. She has wheezes. She has no  rales. She exhibits no tenderness.  Abdominal: Soft. Bowel sounds are normal.  Lymphadenopathy:    She has no cervical adenopathy.  Neurological: She is alert and oriented to person, place, and time.  Skin: Skin is warm and dry.      Assessment & Plan:  1. Acute bronchitis- patient is breast feeding twins - levofloxacin (LEVAQUIN) 500 MG tablet; Take 1 tablet (500 mg total) by mouth daily.  Dispense: 10 tablet; Refill: 0 - predniSONE (DELTASONE) 20 MG tablet; Take one pill two times daily for 3 days, take one pill daily for 4 days.  Dispense: 10 tablet; Refill: 0

## 2013-04-13 NOTE — Patient Instructions (Signed)

## 2013-09-17 ENCOUNTER — Other Ambulatory Visit: Payer: Self-pay | Admitting: Physician Assistant

## 2013-09-28 ENCOUNTER — Other Ambulatory Visit: Payer: Self-pay

## 2013-09-28 MED ORDER — SERTRALINE HCL 100 MG PO TABS: 100.0000 mg | ORAL_TABLET | Freq: Two times a day (BID) | ORAL | Status: DC

## 2013-09-28 NOTE — Telephone Encounter (Signed)
Spoke with patient and advised her per Marchelle FolksAmanda the Zoloft was refilled but she needs to schedule her physical soon, patient advised she would

## 2014-02-14 ENCOUNTER — Encounter: Payer: Self-pay | Admitting: Physician Assistant

## 2014-03-27 IMAGING — US US OB DETAIL+14 WK
1 series · 15 of 28 positions shown · non-contrast
Comparison: none

[Series 1: us ob detail+14 wk · 0.28mm/px · 102 acquisitions, 15 frames shown]
[im 1/102]
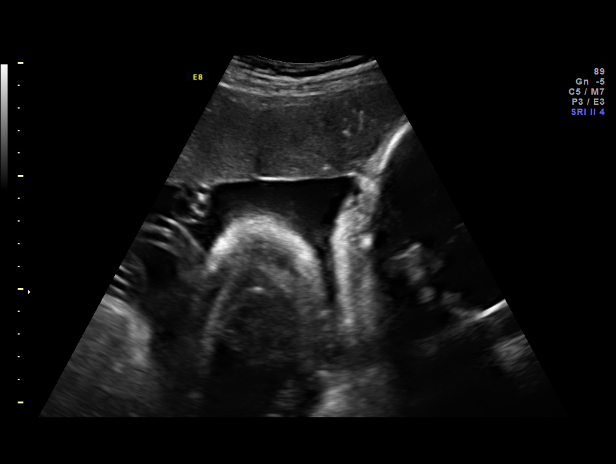
[im 8/102]
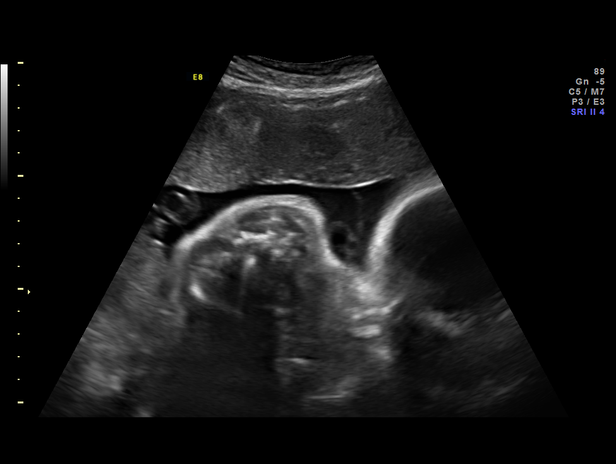
[im 15/102]
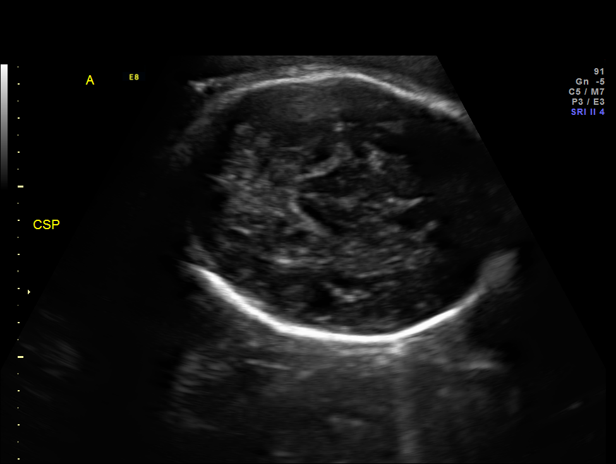
[im 23/102]
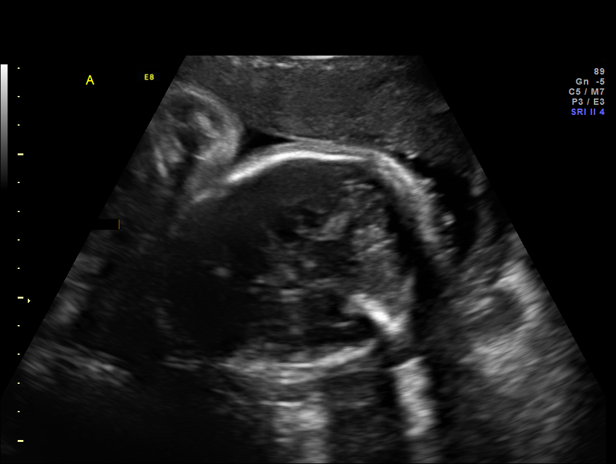
[im 30/102]
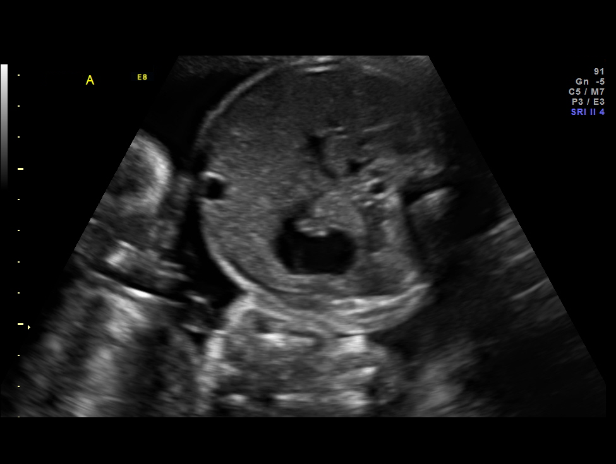
[im 38/102]
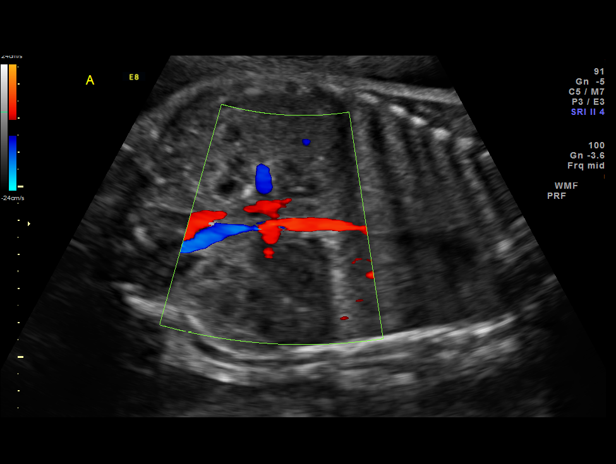
[im 45/102]
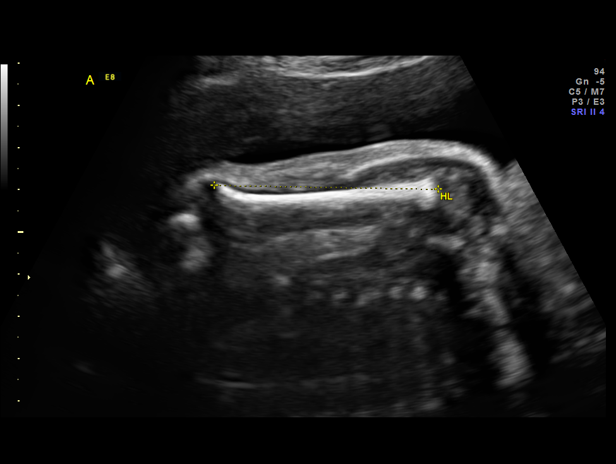
[im 53/102]
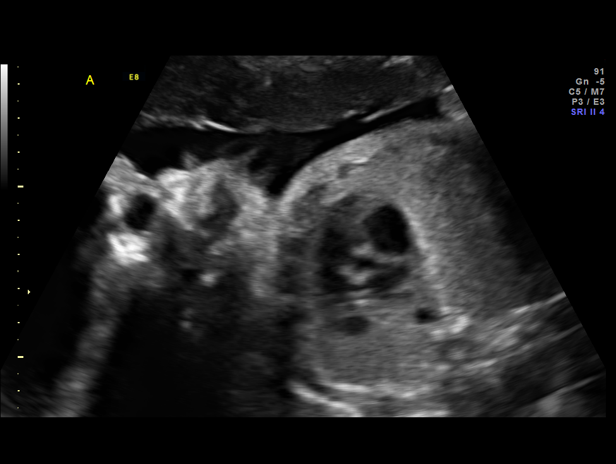
[im 57/102]
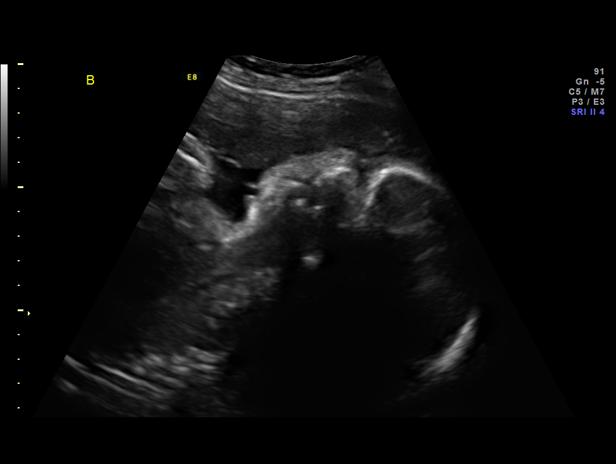
[im 64/102]
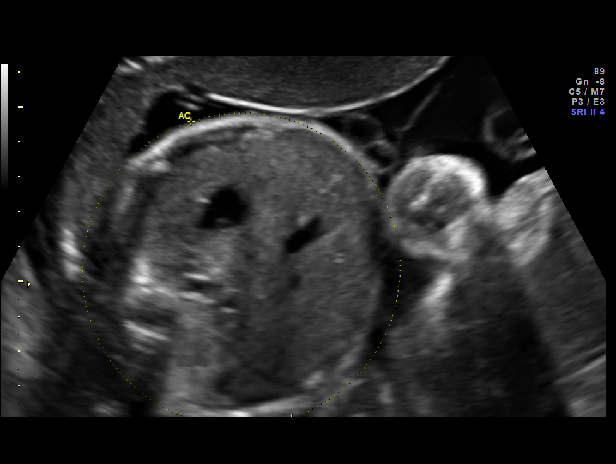
[im 72/102]
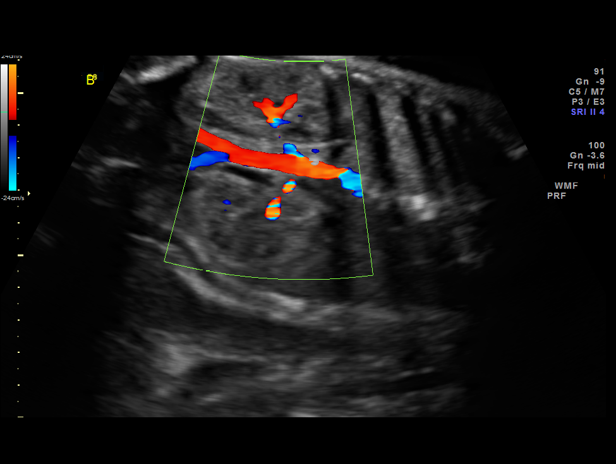
[im 79/102]
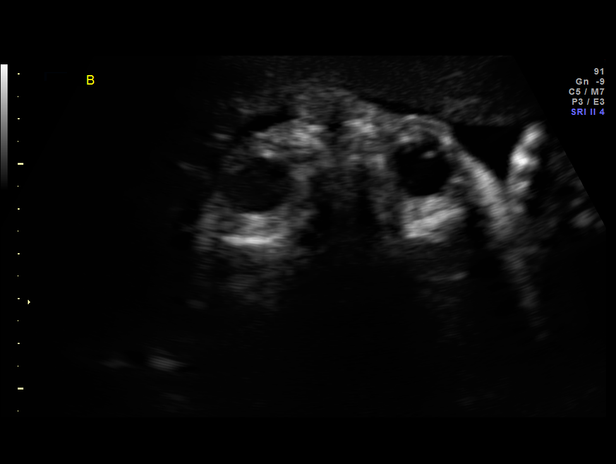
[im 87/102]
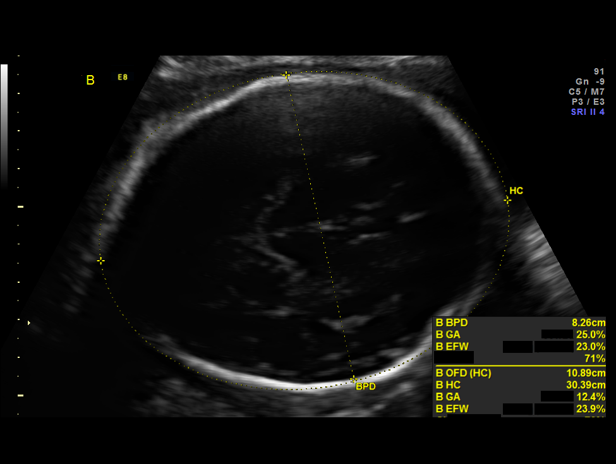
[im 94/102]
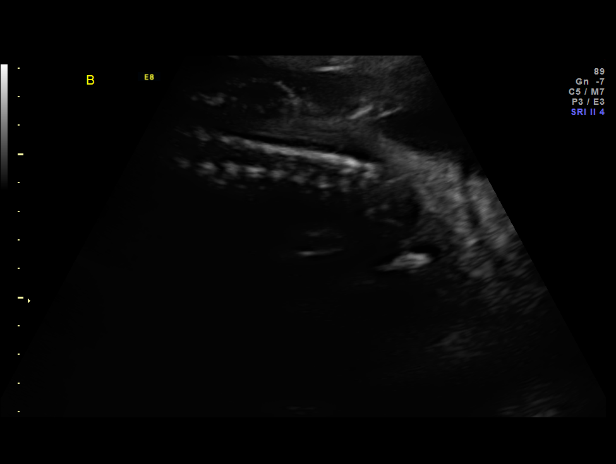
[im 102/102]
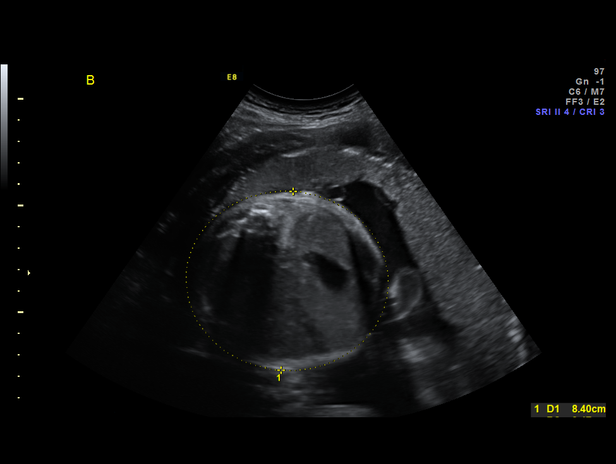

[15 of 28 positions shown; findings below may reference images not displayed]

OBSTETRICS REPORT
                      (Signed Final 10/12/2012 [DATE])

Service(s) Provided

 US OB DETAIL + 14 WK                                  76811.0
 US OB DETAIL ADDL GEST + 14 WK                        76811.1
Indications

 Detailed fetal anatomic survey
 Twin gestation, Grefg
 Size less than dates (Small for gestational [AGE]
 FGR) Baby B
Fetal Evaluation (Fetus A)

 Num Of Fetuses:    2
 Fetal Heart Rate:  130                          bpm
 Cardiac Activity:  Observed
 Fetal Lie:         Left Fetus
 Presentation:      Cephalic
 Placenta:          Anterior, above cervical os

 Membrane Desc:     Dividing
                    Membrane seen
                    - Monochorionic

 Amniotic Fluid
 AFI FV:      Subjectively within normal limits
                                             Larg Pckt:     5.1  cm
Biometry (Fetus A)

 BPD:     76.6  mm     G. Age:  30w 5d                CI:         70.5   70 - 86
 OFD:    108.6  mm                                    FL/HC:      20.4   19.4 -

 HC:     297.2  mm     G. Age:  32w 6d        4  %    HC/AC:      1.04   0.96 -

 AC:     285.6  mm     G. Age:  32w 4d       17  %    FL/BPD:     79.0   71 - 87
 FL:      60.5  mm     G. Age:  31w 3d      < 3  %    FL/AC:      21.2   20 - 24
 HUM:     52.3  mm     G. Age:  30w 3d      < 5  %

 Est. FW:    0733  gm      4 lb 3 oz     25  %     FW Discordancy         0  %
Gestational Age (Fetus A)

 LMP:           34w 6d        Date:  02/11/12                 EDD:   11/17/12
 U/S Today:     31w 6d                                        EDD:   12/08/12
 Best:          34w 0d     Det. By:  Early Ultrasound         EDD:   11/23/12
                                     (05/13/12)
Anatomy (Fetus A)

 Cranium:          Appears normal         Aortic Arch:      Appears normal
 Fetal Cavum:      Appears normal         Ductal Arch:      Appears normal
 Ventricles:       Appears normal         Diaphragm:        Appears normal
 Choroid Plexus:   Appears normal         Stomach:          Appears normal, left
                                                            sided
 Cerebellum:       Appears normal         Abdomen:          Appears normal
 Posterior Fossa:  Not well visualized    Abdominal Wall:   Appears nml (cord
                                                            insert, abd wall)
 Nuchal Fold:      Not applicable (>20    Cord Vessels:     Appears normal (3
                   wks GA)                                  vessel cord)
 Face:             Appears normal         Kidneys:          Appear normal
                   (orbits and profile)
 Lips:             Appears normal         Bladder:          Appears normal
 Heart:            Appears normal         Spine:            Not well visualized
                   (4CH, axis, and
                   situs)
 RVOT:             Appears normal         Lower             Not well visualized
                                          Extremities:
 LVOT:             Appears normal         Upper             Not well visualized
                                          Extremities:

 Other:  Fetus appears to be a female.

Fetal Evaluation (Fetus B)

 Num Of Fetuses:    2
 Fetal Heart Rate:  147                          bpm
 Cardiac Activity:  Observed
 Fetal Lie:         Right Fetus
 Presentation:      Cephalic
 Placenta:          Anterior, above cervical os

 Membrane Desc:     Dividing
                    Membrane seen
                    - Monochorionic

 Amniotic Fluid
 AFI FV:      Subjectively within normal limits
                                             Larg Pckt:     4.4  cm
Biometry (Fetus B)

 BPD:     82.5  mm     G. Age:  33w 1d                CI:         77.2   70 - 86
 OFD:    106.9  mm                                    FL/HC:      19.7   19.4 -

 HC:     301.7  mm     G. Age:  33w 3d        9  %    HC/AC:      1.06   0.96 -

 AC:     283.4  mm     G. Age:  32w 2d       13  %    FL/BPD:     72.0   71 - 87
 FL:      59.4  mm     G. Age:  31w 0d      < 3  %    FL/AC:      21.0   20 - 24
 HUM:     51.8  mm     G. Age:  30w 2d      < 5  %

 Est. FW:    1909  gm      4 lb 3 oz     25  %     FW Discordancy      0 \ 0 %
Gestational Age (Fetus B)
 LMP:           34w 6d        Date:  02/11/12                 EDD:   11/17/12
 U/S Today:     32w 3d                                        EDD:   12/04/12
 Best:          34w 0d     Det. By:  Early Ultrasound         EDD:   11/23/12
                                     (05/13/12)
Anatomy (Fetus B)

 Cranium:          Appears normal         Aortic Arch:      Not well visualized
 Fetal Cavum:      Appears normal         Ductal Arch:      Not well visualized
 Ventricles:       Appears normal         Diaphragm:        Appears normal
 Choroid Plexus:   Not well visualized    Stomach:          Appears normal, left
                                                            sided
 Cerebellum:       Not well visualized    Abdomen:          Appears normal
 Posterior Fossa:  Not well visualized    Abdominal Wall:   Appears nml (cord
                                                            insert, abd wall)
 Nuchal Fold:      Not applicable (>20    Cord Vessels:     Appears normal (3
                   wks GA)                                  vessel cord)
 Face:             Appears normal         Kidneys:          Appear normal
                   (orbits and profile)
 Lips:             Appears normal         Bladder:          Appears normal
 Heart:            Appears normal         Spine:            Limited views
                   (4CH, axis, and                          appear normal
                   situs)
 RVOT:             Appears normal         Lower             Not well visualized
                                          Extremities:
 LVOT:             Appears normal         Upper             Not well visualized
                                          Extremities:

 Other:  Fetus appears to be a female.
Cervix Uterus Adnexa

 Cervix:       Not visualized (advanced GA >24wks)
Impression

 Monochorionic/diamniotic twin pregnancy at 34+0 weeks
 (cephalic/cephalic)
 Normal detailed fetal anatomy x 2; limited views of posterior
 fossas and extremities
 Normal amniotic fluid volume x 2
 EFWs for both twins were at the 25th %tile
 No US indications of TTTS
Recommendations

 Suggested delivery is between 36 and 37 weeks for Tiger/Jaksonn
 twins

## 2015-01-14 ENCOUNTER — Other Ambulatory Visit: Payer: Self-pay | Admitting: Internal Medicine

## 2015-01-14 MED ORDER — TRAMADOL HCL 50 MG PO TABS: ORAL_TABLET | ORAL | Status: AC

## 2015-01-14 MED ORDER — AMOXICILLIN 500 MG PO CAPS: ORAL_CAPSULE | ORAL | Status: AC

## 2015-04-03 ENCOUNTER — Ambulatory Visit: Payer: Self-pay | Admitting: Internal Medicine

## 2015-06-25 ENCOUNTER — Emergency Department (HOSPITAL_BASED_OUTPATIENT_CLINIC_OR_DEPARTMENT_OTHER)
Admission: EM | Admit: 2015-06-25 | Discharge: 2015-06-25 | Disposition: A | Discharge status: Home / Self Care | Payer: BLUE CROSS/BLUE SHIELD | Attending: Emergency Medicine | Admitting: Emergency Medicine

## 2015-06-25 ENCOUNTER — Encounter (HOSPITAL_BASED_OUTPATIENT_CLINIC_OR_DEPARTMENT_OTHER): Payer: Self-pay | Admitting: *Deleted

## 2015-06-25 DIAGNOSIS — Y9389 Activity, other specified: Secondary | ICD-10-CM | POA: Insufficient documentation

## 2015-06-25 DIAGNOSIS — Y9289 Other specified places as the place of occurrence of the external cause: Secondary | ICD-10-CM | POA: Insufficient documentation

## 2015-06-25 DIAGNOSIS — F429 Obsessive-compulsive disorder, unspecified: Secondary | ICD-10-CM | POA: Diagnosis not present

## 2015-06-25 DIAGNOSIS — Z23 Encounter for immunization: Secondary | ICD-10-CM | POA: Insufficient documentation

## 2015-06-25 DIAGNOSIS — J45909 Unspecified asthma, uncomplicated: Secondary | ICD-10-CM | POA: Diagnosis not present

## 2015-06-25 DIAGNOSIS — S59911A Unspecified injury of right forearm, initial encounter: Secondary | ICD-10-CM | POA: Diagnosis present

## 2015-06-25 DIAGNOSIS — Y998 Other external cause status: Secondary | ICD-10-CM | POA: Diagnosis not present

## 2015-06-25 DIAGNOSIS — F419 Anxiety disorder, unspecified: Secondary | ICD-10-CM | POA: Insufficient documentation

## 2015-06-25 DIAGNOSIS — Z79899 Other long term (current) drug therapy: Secondary | ICD-10-CM | POA: Insufficient documentation

## 2015-06-25 DIAGNOSIS — F329 Major depressive disorder, single episode, unspecified: Secondary | ICD-10-CM | POA: Insufficient documentation

## 2015-06-25 DIAGNOSIS — F17210 Nicotine dependence, cigarettes, uncomplicated: Secondary | ICD-10-CM | POA: Insufficient documentation

## 2015-06-25 DIAGNOSIS — S5011XA Contusion of right forearm, initial encounter: Secondary | ICD-10-CM | POA: Insufficient documentation

## 2015-06-25 DIAGNOSIS — Z8619 Personal history of other infectious and parasitic diseases: Secondary | ICD-10-CM | POA: Insufficient documentation

## 2015-06-25 DIAGNOSIS — W5501XA Bitten by cat, initial encounter: Secondary | ICD-10-CM | POA: Insufficient documentation

## 2015-06-25 MED ORDER — TETANUS-DIPHTH-ACELL PERTUSSIS 5-2.5-18.5 LF-MCG/0.5 IM SUSP
0.5000 mL | Freq: Once | INTRAMUSCULAR | Status: AC
  Administered 2015-06-25: 0.5 mL via INTRAMUSCULAR
  Filled 2015-06-25: qty 0.5

## 2015-06-25 MED ORDER — IBUPROFEN 800 MG PO TABS: 800.0000 mg | ORAL_TABLET | Freq: Three times a day (TID) | ORAL | Status: DC

## 2015-06-25 MED ORDER — AMOXICILLIN-POT CLAVULANATE 875-125 MG PO TABS: 1.0000 | ORAL_TABLET | Freq: Two times a day (BID) | ORAL | Status: DC

## 2015-06-25 NOTE — Discharge Instructions (Signed)
Take your medications as prescribed. I recommend eating prior to taking ibuprofen to prevent GI side effects. I also recommend elevating her arm and applying ice for 15-20 minutes 3-4 times daily to help with pain and swelling. Follow-up with your primary care provider or return to the ED in 3 days for wound recheck. Return to the emergency department seeing her symptoms worsen or new onset of fever, worsening redness, swelling, drainage, numbness, and healing, weakness.

## 2015-06-25 NOTE — ED Notes (Signed)
States received a cat bite last pm, rt forearm, two small puncture wounds noted, some mild swelling noted, and slight bruising noted, Cat is patients own pet, animal UTD on shots

## 2015-06-25 NOTE — ED Notes (Signed)
Reviewed dc instructions and rx with pt as written by ED provider, discussed keeping injury site clean and discussed when should return to ED. Also stressed importance of taking abx as prescribed. Opportunity for questions provided

## 2015-06-25 NOTE — ED Provider Notes (Signed)
CSN: [id number]     Arrival date & time 06/25/15  5638 History   First MD Initiated Contact with Patient 06/25/15 1012     Chief Complaint  Patient presents with  . Animal Bite     (Consider location/radiation/quality/duration/timing/severity/associated sxs/prior Treatment) HPI   Patient is a 35 year old female with past medical history of depression and anxiety who presents to the ED with complaints of cat bite, onset 7 PM last night. Patient reports while she was at home yesterday her dog aggravated her cat that was clean in her lap resulting in the cat biting her right forearm. Patient reports having small amount of bleeding from the wound initially but notes the bleeding was immediately controlled. She states she washed out the puncture wounds with water and hydrogen peroxide. Patient reports having mild pain, swelling and bruising to her right forearm this morning. Denies fever, drainage, numbness, tingling, weakness. Patient reports she is unsure when her last tetanus shot was. She notes her cats rabies vaccinations are up-to-date.  Past Medical History  Diagnosis Date  . Hx of varicella   . OCD (obsessive compulsive disorder)   . Abnormal Pap smear     LEEP 2008  . Anxiety   . Asthma   . Depression    Past Surgical History  Procedure Laterality Date  . Leep    . Tonsillectomy    . Tubal ligation Bilateral 11/04/2012    Procedure: POST PARTUM TUBAL LIGATION;  Surgeon: Mickel Baas, MD;  Location: [facility];  Service: Gynecology;  Laterality: Bilateral;   Family History  Problem Relation Age of Onset  . Thyroid disease Father   . Drug abuse Sister   . Thyroid disease Sister   . Hypertension Maternal Grandmother   . Hypertension Paternal Grandmother   . Cancer Paternal Grandmother     breast  . Diabetes Paternal Grandmother    Social History  Substance Use Topics  . Smoking status: Light Tobacco Smoker    Types: Cigarettes  . Smokeless tobacco: Never Used  . Alcohol  Use: No   OB History    Gravida Para Term Preterm AB TAB SAB Ectopic Multiple Living   0 0 0 0 0 1 3     Review of Systems  Constitutional: Negative for fever.  Musculoskeletal: Positive for myalgias (right forearm).  Skin: Positive for wound (cat bite).  Neurological: Negative for weakness and numbness.      Allergies  Review of patient's allergies indicates no known allergies.  Home Medications   Prior to Admission medications   Medication Sig Start Date End Date Taking? Authorizing Provider  sertraline (ZOLOFT) 100 MG tablet Take 1 tablet (100 mg total) by mouth 2 (two) times daily. 09/28/13  Yes Quentin Mulling, PA-C  amoxicillin-clavulanate (AUGMENTIN) 875-125 MG tablet Take 1 tablet by mouth 2 (two) times daily. 06/25/15   Barrett Henle, PA-C  cetirizine (ZYRTEC) 10 MG tablet Take 10 mg by mouth daily.    Historical Provider, MD  FENUGREEK PO Take by mouth daily.    Historical Provider, MD  ibuprofen (ADVIL,MOTRIN) 800 MG tablet Take 1 tablet (800 mg total) by mouth 3 (three) times daily. 06/25/15   Barrett Henle, PA-C  predniSONE (DELTASONE) 20 MG tablet Take one pill two times daily for 3 days, take one pill daily for 4 days. 04/13/13   Quentin Mulling, PA-C  Prenatal Vit-Fe Fumarate-FA (PRENATAL MULTIVITAMIN) TABS Take 1 tablet by mouth daily at 12 noon.  Historical Provider, MD   BP 111/72 mmHg  Pulse 90  Temp(Src) 98.1 F (36.7 C) (Oral)  Resp 16  Ht 5' (1.524 m)  Wt 61.236 kg  BMI 26.37 kg/m2  SpO2 100% Physical Exam  Constitutional: She is oriented to person, place, and time. She appears well-developed and well-nourished. No distress.  HENT:  Head: Normocephalic and atraumatic.  Eyes: Conjunctivae and EOM are normal. Right eye exhibits no discharge. Left eye exhibits no discharge. No scleral icterus.  Neck: Normal range of motion. Neck supple.  Cardiovascular: Normal rate.   Pulmonary/Chest: Effort normal.  Musculoskeletal: Normal  range of motion. She exhibits tenderness.       Right forearm: She exhibits tenderness and swelling. She exhibits no bony tenderness, no edema, no deformity and no laceration.  2 small healing puncture wounds noted to dorsal/lateral aspect of right proximal forearm with mild surrounding swelling and ecchymosis, no surrounding erythema, warmth or drainage. Tenderness to palpation over right proximal forearm. Full range of motion of right hand, wrist, elbow and shoulder. Equal grip strength bilaterally. 2+ radial pulses. Cap refill less than 2. Sensation grossly intact.  Neurological: She is alert and oriented to person, place, and time.  Skin: She is not diaphoretic.  Nursing note and vitals reviewed.   ED Course  Procedures (including critical care time) Labs Review Labs Reviewed - No data to display  Imaging Review No results found. I have personally reviewed and evaluated these images and lab results as part of my medical decision-making.   EKG Interpretation None      MDM   Final diagnoses:  Cat bite, initial encounter   Patient presents with cat bite to her right forearm that occurred last night. Endorses mild swelling, bruising and pain to site. Patient cleaned wound initially with water and hydrogen peroxide at home. Tetanus status unknown. VSS. Exam revealed 2 small healing puncture wounds noted to right proximal forearm with mild surrounding swelling and ecchymoses, no surrounding erythema or warmth or drainage, no signs of cellulitis. Full range of motion of right upper extremity, right arm neurovascularly intact. No signs of tenosynovitis. Patient given tetanus and ED. Due to patient presenting over 12 hours after initial injury will plan to discharge patient home with Augmentin. Discussed symptomatic treatment patient advised to have her return to the ED in 3 days for wound recheck.  Evaluation does not show pathology requring ongoing emergent intervention or admission. Pt is  hemodynamically stable and mentating appropriately. All questions answered. Return precautions discussed and follow up given.     Satira Sarkicole Elizabeth ElizabethtownNadeau, [place] JerseyPA-C 06/25/15 1041  Alvira MondayErin Schlossman, MD 06/25/15 2249

## 2018-02-08 ENCOUNTER — Encounter: Payer: Self-pay | Admitting: Emergency Medicine

## 2018-02-08 DIAGNOSIS — F319 Bipolar disorder, unspecified: Secondary | ICD-10-CM

## 2018-02-08 DIAGNOSIS — G4700 Insomnia, unspecified: Secondary | ICD-10-CM

## 2018-02-08 DIAGNOSIS — F411 Generalized anxiety disorder: Secondary | ICD-10-CM | POA: Insufficient documentation

## 2018-02-16 ENCOUNTER — Ambulatory Visit: Payer: Self-pay | Admitting: Physician Assistant

## 2018-03-04 ENCOUNTER — Encounter: Payer: Self-pay | Admitting: Physician Assistant

## 2018-03-04 ENCOUNTER — Ambulatory Visit: Payer: BLUE CROSS/BLUE SHIELD | Admitting: Physician Assistant

## 2018-03-04 DIAGNOSIS — G4700 Insomnia, unspecified: Secondary | ICD-10-CM

## 2018-03-04 DIAGNOSIS — F3175 Bipolar disorder, in partial remission, most recent episode depressed: Secondary | ICD-10-CM

## 2018-03-04 DIAGNOSIS — F411 Generalized anxiety disorder: Secondary | ICD-10-CM

## 2018-03-04 DIAGNOSIS — F422 Mixed obsessional thoughts and acts: Secondary | ICD-10-CM

## 2018-03-04 MED ORDER — LAMOTRIGINE 100 MG PO TABS: ORAL_TABLET | ORAL | 0 refills | Status: DC

## 2018-03-04 NOTE — Progress Notes (Signed)
[facility] Med Check  Patient ID: Kristen Juarez,  MRN: [id number]  PCP: Mila Palmer, MD  Date of Evaluation: 03/05/2018 Time spent:15 minutes  Chief Complaint:  Chief Complaint    Follow-up      HISTORY/CURRENT STATUS: HPI Here for 6 week med check.  States that increasing the Lamictal and changing the way she's taking it, has helped a lot!  "I think we're on a good regimen."  Patient denies loss of interest in usual activities and is able to enjoy things.  Denies decreased energy or motivation.  Appetite has not changed.  No extreme sadness, tearfulness, or feelings of hopelessness.  Denies any changes in concentration, making decisions or remembering things.  Denies suicidal or homicidal thoughts.  Patient denies increased energy with decreased need for sleep, no increased talkativeness, no racing thoughts, no impulsivity or risky behaviors, no increased spending, no increased libido, no grandiosity.  Anxiety is well-controlled with current tx.   She sleeps well.  Work is going fine.  Individual Medical History/ Review of Systems: Changes? :No   Allergies: Patient has no known allergies.  Current Medications:  Current Outpatient Medications:  .  ALPRAZolam (XANAX) 0.5 MG tablet, Take 0.5 mg by mouth 3 (three) times daily as needed for anxiety., Disp: , Rfl:  .  Apple Cider Vinegar 600 MG CAPS, Take 600 mg by mouth daily., Disp: , Rfl:  .  Ascorbic Acid (VITAMIN C) 1000 MG tablet, Take 5,000 mg by mouth daily., Disp: , Rfl:  .  b complex vitamins tablet, Take 1 tablet by mouth daily., Disp: , Rfl:  .  calcium carbonate (OSCAL) 1500 (600 Ca) MG TABS tablet, Take 600 mg of elemental calcium by mouth daily., Disp: , Rfl:  .  Cholecalciferol (VITAMIN D-3) 125 MCG (5000 UT) TABS, Take 5,000 mg by mouth 3 (three) times a week., Disp: , Rfl:  .  Coconut Oil 1000 MG CAPS, Take 1,000 mg by mouth daily., Disp: , Rfl:  .  levocetirizine (XYZAL) 5 MG tablet, Take 5 mg by  mouth every evening., Disp: , Rfl:  .  Oxcarbazepine (TRILEPTAL) 300 MG tablet, Take 300 mg by mouth 2 (two) times daily., Disp: , Rfl:  .  Prenatal Vit-Fe Fumarate-FA (PRENATAL MULTIVITAMIN) TABS, Take 1 tablet by mouth daily at 12 noon., Disp: , Rfl:  .  topiramate (TOPAMAX) 100 MG tablet, Take 100 mg by mouth daily., Disp: , Rfl:  .  lamoTRIgine (LAMICTAL) 100 MG tablet, 1.5 pills in the morning, 1 pill at night., Disp: 225 tablet, Rfl: 0 Medication Side Effects: none  Family Medical/ Social History: Changes? No  MENTAL HEALTH EXAM:  There were no vitals taken for this visit.There is no height or weight on file to calculate BMI.  General Appearance: Casual  Eye Contact:  Good  Speech:  Clear and Coherent  Volume:  Normal  Mood:  Euthymic  Affect:  Appropriate  Thought Process:  Goal Directed  Orientation:  Full (Time, Place, and Person)  Thought Content: Logical   Suicidal Thoughts:  No  Homicidal Thoughts:  No  Memory:  WNL  Judgement:  Good  Insight:  Good  Psychomotor Activity:  Normal  Concentration:  Concentration: Good  Recall:  Good  Fund of Knowledge: Good  Language: Good  Assets:  Desire for Improvement  ADL's:  Intact  Cognition: WNL  Prognosis:  Good    DIAGNOSES:    ICD-10-CM   1. Bipolar I, most recent episode depressed, partial remission (HCC) F31.75  2. Generalized anxiety disorder F41.1   3. Insomnia, unspecified type G47.00   4. Mixed obsessional thoughts and acts F42.2     Receiving Psychotherapy: No    RECOMMENDATIONS: Continue all current medications without change. Return in 3 months or sooner as needed.   Melony Overlyeresa Triana Coover, PA-C

## 2018-06-04 ENCOUNTER — Ambulatory Visit: Payer: BLUE CROSS/BLUE SHIELD | Admitting: Physician Assistant

## 2018-06-05 ENCOUNTER — Telehealth: Payer: Self-pay | Admitting: Physician Assistant

## 2018-06-05 NOTE — Telephone Encounter (Signed)
Patient would like a return call from [place]. Stated she was told by Rosey Bath to speak with her directly concerning her insurance/med situation.

## 2018-06-08 ENCOUNTER — Other Ambulatory Visit: Payer: Self-pay | Admitting: Physician Assistant

## 2018-06-08 ENCOUNTER — Other Ambulatory Visit: Payer: Self-pay

## 2018-06-08 MED ORDER — ALPRAZOLAM 0.5 MG PO TABS: 0.5000 mg | ORAL_TABLET | Freq: Three times a day (TID) | ORAL | 2 refills | Status: AC | PRN

## 2018-06-08 MED ORDER — LAMOTRIGINE 100 MG PO TABS: ORAL_TABLET | ORAL | 0 refills | Status: AC

## 2018-06-08 NOTE — Telephone Encounter (Signed)
Uses [facility] [address]

## 2018-06-08 NOTE — Telephone Encounter (Signed)
Unfortunately pt's insurance changed to the [facility] local through [facility] and now she's unable to see you as her provider. Requesting refills on her medication till she gets a new psychiatrist. Lamictal 100 mg 1 in the am and 1.5 in the pm. Also the xanax, states she is good on her trileptal just filled.  She wanted you to know she loves you and hates to change and will plan on coming back if insurance changes.

## 2018-06-08 NOTE — Telephone Encounter (Signed)
Please triage.  I'm not sure what she's talking about.  Thanks.

## 2018-06-08 NOTE — Telephone Encounter (Signed)
I hate to hear that! Please tell her that if you have to talk to her again. I pended the orders b/c not sure of pharmacy.

## 2018-08-07 ENCOUNTER — Other Ambulatory Visit: Payer: Self-pay | Admitting: Physician Assistant

## 2018-08-08 NOTE — Telephone Encounter (Signed)
Call pt to see if has new provider or needs refill

## 2018-08-10 NOTE — Telephone Encounter (Signed)
Left voicemail to call back if she needs refill from [place]

## 2018-08-12 ENCOUNTER — Other Ambulatory Visit: Payer: Self-pay | Admitting: Physician Assistant

## 2022-04-26 ENCOUNTER — Emergency Department (HOSPITAL_BASED_OUTPATIENT_CLINIC_OR_DEPARTMENT_OTHER)
Admission: EM | Admit: 2022-04-26 | Discharge: 2022-04-26 | Discharge status: Absent without leave | Payer: BLUE CROSS/BLUE SHIELD

## 2022-04-26 ENCOUNTER — Other Ambulatory Visit: Payer: Self-pay

## 2022-04-26 NOTE — ED Triage Notes (Signed)
Pt told registration leaving prefers to go to [facility]
